# Patient Record
Sex: Female | Born: 1945
Health system: Southern US, Community
[De-identification: ages and names within clinical notes are randomized; demographics above are authoritative.]

## PROBLEM LIST (undated history)

## (undated) DIAGNOSIS — E041 Nontoxic single thyroid nodule: Secondary | ICD-10-CM

## (undated) DIAGNOSIS — H9312 Tinnitus, left ear: Secondary | ICD-10-CM

## (undated) DIAGNOSIS — F32A Depression, unspecified: Secondary | ICD-10-CM

## (undated) DIAGNOSIS — E7800 Pure hypercholesterolemia, unspecified: Secondary | ICD-10-CM

## (undated) DIAGNOSIS — I10 Essential (primary) hypertension: Secondary | ICD-10-CM

## (undated) DIAGNOSIS — E119 Type 2 diabetes mellitus without complications: Secondary | ICD-10-CM

## (undated) DIAGNOSIS — F329 Major depressive disorder, single episode, unspecified: Secondary | ICD-10-CM

## (undated) DIAGNOSIS — E0590 Thyrotoxicosis, unspecified without thyrotoxic crisis or storm: Secondary | ICD-10-CM

## (undated) DIAGNOSIS — M1990 Unspecified osteoarthritis, unspecified site: Secondary | ICD-10-CM

## (undated) HISTORY — DX: Tinnitus, left ear: H93.12

## (undated) HISTORY — PX: KNEE SURGERY: SHX244

## (undated) HISTORY — DX: Nontoxic single thyroid nodule: E04.1

## (undated) HISTORY — DX: Depression, unspecified: F32.A

## (undated) HISTORY — PX: APPENDECTOMY: SHX54

## (undated) HISTORY — DX: Thyrotoxicosis, unspecified without thyrotoxic crisis or storm: E05.90

## (undated) HISTORY — DX: Major depressive disorder, single episode, unspecified: F32.9

## (undated) HISTORY — DX: Unspecified osteoarthritis, unspecified site: M19.90

---

## 2009-05-30 ENCOUNTER — Emergency Department (HOSPITAL_COMMUNITY): Admission: EM | Admit: 2009-05-30 | Discharge: 2009-05-30 | Payer: Self-pay | Admitting: Emergency Medicine

## 2010-03-29 ENCOUNTER — Ambulatory Visit (HOSPITAL_COMMUNITY): Admission: RE | Admit: 2010-03-29 | Discharge: 2010-03-29 | Payer: Self-pay | Admitting: Family Medicine

## 2012-03-13 ENCOUNTER — Emergency Department (HOSPITAL_BASED_OUTPATIENT_CLINIC_OR_DEPARTMENT_OTHER)
Admission: EM | Admit: 2012-03-13 | Discharge: 2012-03-14 | Disposition: A | Discharge status: Home / Self Care | Payer: No Typology Code available for payment source | Attending: Emergency Medicine | Admitting: Emergency Medicine

## 2012-03-13 ENCOUNTER — Emergency Department (HOSPITAL_BASED_OUTPATIENT_CLINIC_OR_DEPARTMENT_OTHER): Payer: No Typology Code available for payment source

## 2012-03-13 ENCOUNTER — Encounter (HOSPITAL_BASED_OUTPATIENT_CLINIC_OR_DEPARTMENT_OTHER): Payer: Self-pay | Admitting: Emergency Medicine

## 2012-03-13 DIAGNOSIS — M546 Pain in thoracic spine: Secondary | ICD-10-CM | POA: Insufficient documentation

## 2012-03-13 DIAGNOSIS — E119 Type 2 diabetes mellitus without complications: Secondary | ICD-10-CM | POA: Insufficient documentation

## 2012-03-13 DIAGNOSIS — I10 Essential (primary) hypertension: Secondary | ICD-10-CM | POA: Insufficient documentation

## 2012-03-13 DIAGNOSIS — M47812 Spondylosis without myelopathy or radiculopathy, cervical region: Secondary | ICD-10-CM | POA: Insufficient documentation

## 2012-03-13 DIAGNOSIS — T148XXA Other injury of unspecified body region, initial encounter: Secondary | ICD-10-CM | POA: Insufficient documentation

## 2012-03-13 HISTORY — DX: Type 2 diabetes mellitus without complications: E11.9

## 2012-03-13 HISTORY — DX: Pure hypercholesterolemia, unspecified: E78.00

## 2012-03-13 HISTORY — DX: Essential (primary) hypertension: I10
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
## 2012-03-14 MED ORDER — HYDROCODONE-ACETAMINOPHEN 5-500 MG PO TABS: 1.0000 | ORAL_TABLET | Freq: Four times a day (QID) | ORAL | Status: DC | PRN

## 2012-03-14 MED ORDER — CYCLOBENZAPRINE HCL 10 MG PO TABS
10.0000 mg | ORAL_TABLET | Freq: Once | ORAL | Status: AC
  Administered 2012-03-14: 10 mg via ORAL
  Filled 2012-03-14: qty 1

## 2012-03-14 MED ORDER — CYCLOBENZAPRINE HCL 10 MG PO TABS: 10.0000 mg | ORAL_TABLET | Freq: Two times a day (BID) | ORAL | Status: DC | PRN

## 2012-03-14 NOTE — ED Notes (Signed)
MD at bedside. 
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
## 2012-03-14 NOTE — ED Notes (Signed)
Pt up ambulating around dept stating that she was going outside to smoke. Pt remains in C-collar, pt reminded that she had not been cleared by the EDP to be ambulating or to remove collar. Pt then reluctantly ambulated back to her room to await disposition. NAD noted, ambulated with a steady gait.

## 2012-07-02 ENCOUNTER — Other Ambulatory Visit: Payer: Self-pay | Admitting: Anesthesiology

## 2012-07-02 DIAGNOSIS — M545 Low back pain: Secondary | ICD-10-CM

## 2012-07-05 ENCOUNTER — Ambulatory Visit
Admission: RE | Admit: 2012-07-05 | Discharge: 2012-07-05 | Disposition: A | Discharge status: Home / Self Care | Payer: Medicare Other | Source: Ambulatory Visit | Attending: Anesthesiology | Admitting: Anesthesiology

## 2012-07-05 DIAGNOSIS — M545 Low back pain: Secondary | ICD-10-CM

## 2012-07-06 ENCOUNTER — Other Ambulatory Visit: Payer: Self-pay

## 2012-08-29 ENCOUNTER — Ambulatory Visit: Payer: No Typology Code available for payment source | Attending: Anesthesiology | Admitting: Physical Therapy

## 2012-08-29 DIAGNOSIS — M545 Low back pain, unspecified: Secondary | ICD-10-CM | POA: Insufficient documentation

## 2012-08-29 DIAGNOSIS — M25519 Pain in unspecified shoulder: Secondary | ICD-10-CM | POA: Insufficient documentation

## 2012-08-29 DIAGNOSIS — IMO0001 Reserved for inherently not codable concepts without codable children: Secondary | ICD-10-CM | POA: Insufficient documentation

## 2012-09-05 ENCOUNTER — Ambulatory Visit: Payer: Medicare Other | Attending: Anesthesiology | Admitting: Physical Therapy

## 2012-09-05 DIAGNOSIS — IMO0001 Reserved for inherently not codable concepts without codable children: Secondary | ICD-10-CM | POA: Diagnosis present

## 2012-09-05 DIAGNOSIS — M25519 Pain in unspecified shoulder: Secondary | ICD-10-CM | POA: Diagnosis not present

## 2012-09-05 DIAGNOSIS — M545 Low back pain, unspecified: Secondary | ICD-10-CM | POA: Insufficient documentation

## 2012-09-11 ENCOUNTER — Ambulatory Visit: Payer: No Typology Code available for payment source | Attending: Anesthesiology | Admitting: Physical Therapy

## 2012-09-11 DIAGNOSIS — M545 Low back pain, unspecified: Secondary | ICD-10-CM | POA: Insufficient documentation

## 2012-09-11 DIAGNOSIS — M25519 Pain in unspecified shoulder: Secondary | ICD-10-CM | POA: Insufficient documentation

## 2012-09-11 DIAGNOSIS — IMO0001 Reserved for inherently not codable concepts without codable children: Secondary | ICD-10-CM | POA: Insufficient documentation

## 2012-09-20 ENCOUNTER — Ambulatory Visit
Admission: RE | Admit: 2012-09-20 | Discharge: 2012-09-20 | Disposition: A | Discharge status: Home / Self Care | Payer: Medicare Other | Source: Ambulatory Visit | Attending: Nurse Practitioner | Admitting: Nurse Practitioner

## 2012-09-20 ENCOUNTER — Ambulatory Visit: Payer: No Typology Code available for payment source | Admitting: Physical Therapy

## 2012-09-20 ENCOUNTER — Other Ambulatory Visit: Payer: Self-pay | Admitting: Nurse Practitioner

## 2012-09-20 DIAGNOSIS — R05 Cough: Secondary | ICD-10-CM

## 2012-10-03 ENCOUNTER — Ambulatory Visit: Payer: No Typology Code available for payment source | Attending: Anesthesiology | Admitting: Physical Therapy

## 2012-10-03 DIAGNOSIS — M545 Low back pain, unspecified: Secondary | ICD-10-CM | POA: Insufficient documentation

## 2012-10-03 DIAGNOSIS — IMO0001 Reserved for inherently not codable concepts without codable children: Secondary | ICD-10-CM | POA: Insufficient documentation

## 2012-10-03 DIAGNOSIS — M25519 Pain in unspecified shoulder: Secondary | ICD-10-CM | POA: Insufficient documentation

## 2012-10-10 ENCOUNTER — Ambulatory Visit: Payer: No Typology Code available for payment source | Attending: Anesthesiology | Admitting: Physical Therapy

## 2012-10-10 DIAGNOSIS — M545 Low back pain, unspecified: Secondary | ICD-10-CM | POA: Insufficient documentation

## 2012-10-10 DIAGNOSIS — IMO0001 Reserved for inherently not codable concepts without codable children: Secondary | ICD-10-CM | POA: Insufficient documentation

## 2012-10-10 DIAGNOSIS — M25519 Pain in unspecified shoulder: Secondary | ICD-10-CM | POA: Insufficient documentation

## 2012-11-27 ENCOUNTER — Other Ambulatory Visit: Payer: Self-pay | Admitting: Neurological Surgery

## 2013-03-21 ENCOUNTER — Other Ambulatory Visit: Payer: Self-pay

## 2013-03-21 DIAGNOSIS — Z1231 Encounter for screening mammogram for malignant neoplasm of breast: Secondary | ICD-10-CM

## 2013-03-21 DIAGNOSIS — I10 Essential (primary) hypertension: Secondary | ICD-10-CM | POA: Diagnosis not present

## 2013-03-21 DIAGNOSIS — E785 Hyperlipidemia, unspecified: Secondary | ICD-10-CM | POA: Diagnosis not present

## 2013-03-21 DIAGNOSIS — E119 Type 2 diabetes mellitus without complications: Secondary | ICD-10-CM | POA: Diagnosis not present

## 2013-03-21 DIAGNOSIS — N390 Urinary tract infection, site not specified: Secondary | ICD-10-CM | POA: Diagnosis not present

## 2013-03-21 DIAGNOSIS — R946 Abnormal results of thyroid function studies: Secondary | ICD-10-CM | POA: Diagnosis not present

## 2013-03-21 DIAGNOSIS — Z0000 Encounter for general adult medical examination without abnormal findings: Secondary | ICD-10-CM | POA: Diagnosis not present

## 2013-03-21 DIAGNOSIS — Z124 Encounter for screening for malignant neoplasm of cervix: Secondary | ICD-10-CM | POA: Diagnosis not present

## 2013-03-21 DIAGNOSIS — Z01419 Encounter for gynecological examination (general) (routine) without abnormal findings: Secondary | ICD-10-CM | POA: Diagnosis not present

## 2013-03-21 DIAGNOSIS — E559 Vitamin D deficiency, unspecified: Secondary | ICD-10-CM | POA: Diagnosis not present

## 2013-03-21 DIAGNOSIS — Z1212 Encounter for screening for malignant neoplasm of rectum: Secondary | ICD-10-CM | POA: Diagnosis not present

## 2013-03-25 DIAGNOSIS — L219 Seborrheic dermatitis, unspecified: Secondary | ICD-10-CM | POA: Diagnosis not present

## 2013-04-08 ENCOUNTER — Other Ambulatory Visit (HOSPITAL_COMMUNITY): Payer: Self-pay

## 2013-04-11 ENCOUNTER — Ambulatory Visit: Payer: Medicare Other

## 2013-04-11 ENCOUNTER — Ambulatory Visit
Admission: RE | Admit: 2013-04-11 | Discharge: 2013-04-11 | Disposition: A | Discharge status: Home / Self Care | Payer: Medicare Other | Source: Ambulatory Visit

## 2013-04-11 DIAGNOSIS — Z1231 Encounter for screening mammogram for malignant neoplasm of breast: Secondary | ICD-10-CM | POA: Diagnosis not present

## 2013-04-15 ENCOUNTER — Other Ambulatory Visit: Payer: Self-pay | Admitting: Nurse Practitioner

## 2013-04-15 DIAGNOSIS — R928 Other abnormal and inconclusive findings on diagnostic imaging of breast: Secondary | ICD-10-CM

## 2013-04-17 ENCOUNTER — Inpatient Hospital Stay: Admit: 2013-04-17 | Payer: Self-pay | Admitting: Neurological Surgery

## 2013-04-17 SURGERY — FOR MAXIMUM ACCESS (MAS) POSTERIOR LUMBAR INTERBODY FUSION (PLIF) 1 LEVEL
Anesthesia: General | Site: Back

## 2013-04-18 ENCOUNTER — Ambulatory Visit: Payer: Self-pay

## 2013-04-29 DIAGNOSIS — L84 Corns and callosities: Secondary | ICD-10-CM | POA: Diagnosis not present

## 2013-04-29 DIAGNOSIS — E119 Type 2 diabetes mellitus without complications: Secondary | ICD-10-CM | POA: Diagnosis not present

## 2013-04-29 DIAGNOSIS — M79609 Pain in unspecified limb: Secondary | ICD-10-CM | POA: Diagnosis not present

## 2013-05-02 ENCOUNTER — Ambulatory Visit
Admission: RE | Admit: 2013-05-02 | Discharge: 2013-05-02 | Disposition: A | Discharge status: Home / Self Care | Payer: Medicare Other | Source: Ambulatory Visit | Attending: Nurse Practitioner | Admitting: Nurse Practitioner

## 2013-05-02 DIAGNOSIS — R928 Other abnormal and inconclusive findings on diagnostic imaging of breast: Secondary | ICD-10-CM | POA: Diagnosis not present

## 2013-12-13 DIAGNOSIS — I10 Essential (primary) hypertension: Secondary | ICD-10-CM | POA: Diagnosis not present

## 2013-12-13 DIAGNOSIS — M545 Low back pain, unspecified: Secondary | ICD-10-CM | POA: Diagnosis not present

## 2013-12-13 DIAGNOSIS — E119 Type 2 diabetes mellitus without complications: Secondary | ICD-10-CM | POA: Diagnosis not present

## 2013-12-13 DIAGNOSIS — Z7982 Long term (current) use of aspirin: Secondary | ICD-10-CM | POA: Diagnosis not present

## 2013-12-26 ENCOUNTER — Emergency Department (HOSPITAL_COMMUNITY)
Admission: EM | Admit: 2013-12-26 | Discharge: 2013-12-26 | Discharge status: Against Medical Advice | Payer: Medicare Other | Attending: Emergency Medicine | Admitting: Emergency Medicine

## 2013-12-26 ENCOUNTER — Encounter (HOSPITAL_COMMUNITY): Payer: Self-pay | Admitting: Emergency Medicine

## 2013-12-26 ENCOUNTER — Emergency Department (HOSPITAL_COMMUNITY): Payer: Medicare Other

## 2013-12-26 DIAGNOSIS — R079 Chest pain, unspecified: Secondary | ICD-10-CM | POA: Diagnosis not present

## 2013-12-26 DIAGNOSIS — E119 Type 2 diabetes mellitus without complications: Secondary | ICD-10-CM | POA: Diagnosis not present

## 2013-12-26 DIAGNOSIS — E785 Hyperlipidemia, unspecified: Secondary | ICD-10-CM | POA: Diagnosis not present

## 2013-12-26 DIAGNOSIS — E7800 Pure hypercholesterolemia, unspecified: Secondary | ICD-10-CM | POA: Insufficient documentation

## 2013-12-26 DIAGNOSIS — I10 Essential (primary) hypertension: Secondary | ICD-10-CM | POA: Insufficient documentation

## 2013-12-26 DIAGNOSIS — F17200 Nicotine dependence, unspecified, uncomplicated: Secondary | ICD-10-CM | POA: Diagnosis not present

## 2013-12-26 DIAGNOSIS — R0789 Other chest pain: Secondary | ICD-10-CM | POA: Diagnosis not present

## 2013-12-26 DIAGNOSIS — Z79899 Other long term (current) drug therapy: Secondary | ICD-10-CM | POA: Insufficient documentation

## 2013-12-26 DIAGNOSIS — Z7982 Long term (current) use of aspirin: Secondary | ICD-10-CM | POA: Insufficient documentation

## 2013-12-26 LAB — COMPREHENSIVE METABOLIC PANEL
ALBUMIN: 3.8 g/dL (ref 3.5–5.2)
ALK PHOS: 79 U/L (ref 39–117)
ALT: 23 U/L (ref 0–35)
AST: 24 U/L (ref 0–37)
Anion gap: 15 (ref 5–15)
BILIRUBIN TOTAL: 0.2 mg/dL — AB (ref 0.3–1.2)
BUN: 20 mg/dL (ref 6–23)
CO2: 22 mEq/L (ref 19–32)
Calcium: 9.9 mg/dL (ref 8.4–10.5)
Chloride: 100 mEq/L (ref 96–112)
Creatinine, Ser: 0.67 mg/dL (ref 0.50–1.10)
GFR calc Af Amer: >90 mL/min (ref >90)
GFR calc non Af Amer: 90 mL/min — ABNORMAL LOW (ref >90)
GLUCOSE: 150 mg/dL — AB (ref 70–99)
POTASSIUM: 3.9 meq/L (ref 3.7–5.3)
SODIUM: 137 meq/L (ref 137–147)
TOTAL PROTEIN: 7.3 g/dL (ref 6.0–8.3)

## 2013-12-26 LAB — CBC
HCT: 43.4 % (ref 36.0–46.0)
Hemoglobin: 14.8 g/dL (ref 12.0–15.0)
MCH: 30.9 pg (ref 26.0–34.0)
MCHC: 34.1 g/dL (ref 30.0–36.0)
MCV: 90.6 fL (ref 78.0–100.0)
Platelets: 318 K/uL (ref 150–400)
RBC: 4.79 MIL/uL (ref 3.87–5.11)
RDW: 12.4 % (ref 11.5–15.5)
WBC: 9.8 K/uL (ref 4.0–10.5)

## 2013-12-26 LAB — LIPASE, BLOOD: Lipase: 53 U/L (ref 11–59)

## 2013-12-26 LAB — I-STAT TROPONIN, ED: Troponin i, poc: 0 ng/mL (ref 0.00–0.08)

## 2013-12-26 MED ORDER — ONDANSETRON HCL 4 MG/2ML IJ SOLN
4.0000 mg | Freq: Once | INTRAMUSCULAR | Status: AC
  Administered 2013-12-26: 4 mg via INTRAVENOUS
  Filled 2013-12-26: qty 2

## 2013-12-26 MED ORDER — GI COCKTAIL ~~LOC~~
30.0000 mL | Freq: Once | ORAL | Status: AC
  Administered 2013-12-26: 30 mL via ORAL
  Filled 2013-12-26: qty 30

## 2013-12-26 MED ORDER — FAMOTIDINE IN NACL 20-0.9 MG/50ML-% IV SOLN
20.0000 mg | Freq: Once | INTRAVENOUS | Status: AC
  Administered 2013-12-26: 20 mg via INTRAVENOUS
  Filled 2013-12-26: qty 50

## 2013-12-26 MED ORDER — NITROGLYCERIN 0.4 MG SL SUBL
0.4000 mg | SUBLINGUAL_TABLET | SUBLINGUAL | Status: DC | PRN
Start: 2013-12-26 — End: 2013-12-26
  Administered 2013-12-26: 0.4 mg via SUBLINGUAL
  Filled 2013-12-26: qty 1

## 2013-12-26 MED ORDER — ASPIRIN 325 MG PO TABS
325.0000 mg | ORAL_TABLET | ORAL | Status: AC
  Administered 2013-12-26: 325 mg via ORAL
  Filled 2013-12-26: qty 1

## 2013-12-26 NOTE — ED Provider Notes (Signed)
CSN: [id number]     Arrival date & time 12/26/13  79 History   First MD Initiated Contact with Patient 12/26/13 1537     Chief Complaint  Patient presents with  . Chest Pain     (Consider location/radiation/quality/duration/timing/severity/associated sxs/prior Treatment) HPI 68 year old female with history of tobacco abuse, hypertension, diabetes, hyperlipidemia, presents with less than one hour gradual onset left-sided aching chest discomfort radiating to her left arm with nausea with slight tingling to her left small finger with no cough no shortness breath no fever no sharp or stabbing pain no sudden pain no pleuritic pain and no recent exertion; patient has not had discomfort like this in the past she denies any burning sensation or sour taste in the throat or difficulty swallowing she is no vomiting no abdominal pain no diarrhea no recent bloody stools no recent travel or immobilization in no treatment prior to arrival. Her pain is constant moderately severe aching sensation it started mildly and is gradually worsening. Past Medical History  Diagnosis Date  . Hypertension   . Diabetes mellitus without complication   . High cholesterol    Past Surgical History  Procedure Laterality Date  . Appendectomy    . Knee surgery     No family history on file. History  Substance Use Topics  . Smoking status: Current Every Day Smoker  . Smokeless tobacco: Not on file  . Alcohol Use: No   OB History   Grav Para Term Preterm Abortions TAB SAB Ect Mult Living                 Review of Systems 10 Systems reviewed and are negative for acute change except as noted in the HPI.   Allergies  Review of patient's allergies indicates no known allergies.  Home Medications   Prior to Admission medications   Medication Sig Start Date End Date Taking? Authorizing Provider  aspirin EC 81 MG tablet Take 81 mg by mouth daily.   Yes Historical Provider, MD  glipiZIDE (GLUCOTROL) 10 MG tablet  Take 10 mg by mouth daily before breakfast.    Yes Historical Provider, MD  lisinopril-hydrochlorothiazide (PRINZIDE,ZESTORETIC) 20-25 MG per tablet Take 1 tablet by mouth daily.  12/17/13  Yes Historical Provider, MD  oxyCODONE-acetaminophen (PERCOCET/ROXICET) 5-325 MG per tablet Take 1 tablet by mouth every 6 (six) hours as needed for moderate pain.  12/13/13  Yes Historical Provider, MD  pravastatin (PRAVACHOL) 40 MG tablet Take 40 mg by mouth daily.  10/05/13  Yes Historical Provider, MD   BP 115/70  Pulse 72  Temp(Src) 97.7 F (36.5 C) (Oral)  Resp 18  SpO2 96% Physical Exam  Nursing note and vitals reviewed. Constitutional:  Awake, alert, nontoxic appearance.  HENT:  Head: Atraumatic.  Eyes: Right eye exhibits no discharge. Left eye exhibits no discharge.  Neck: Neck supple.  Cardiovascular: Normal rate and regular rhythm.   No murmur heard. Pulmonary/Chest: Effort normal and breath sounds normal. No respiratory distress. She has no wheezes. She has no rales. She exhibits no tenderness.  Abdominal: Soft. Bowel sounds are normal. She exhibits no distension and no mass. There is no tenderness. There is no rebound and no guarding.  Musculoskeletal: She exhibits no edema and no tenderness.  Baseline ROM, no obvious new focal weakness.  Neurological: She is alert.  Mental status and motor strength appears baseline for patient and situation.  Skin: No rash noted.  Psychiatric: She has a normal mood and affect.    ED Course  Procedures (including critical care time) HEART score 5 w/o troponin results Patient understand and agree with initial ED impression and plan with expectations set for ED visit.   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 Patient states she will return to the emergency department for repeat troponin and observation afterward afterward. [place] Reviewed  COMPREHENSIVE METABOLIC PANEL - Abnormal; Notable for the following:    Glucose, Bld 150 (*)    Total Bilirubin 0.2 (*)    GFR calc non Af Amer 90 (*)    All other components within normal limits  CBC  LIPASE, BLOOD  I-STAT TROPOININ, ED  Randolm Idol, ED    Imaging Review Dg Chest Port 1 View  12/26/2013   CLINICAL DATA:  Chest pain.  EXAM: PORTABLE CHEST - 1 VIEW  COMPARISON:  09/20/2012.  FINDINGS: Mediastinum and hilar structures normal. Lungs are clear. Heart size normal.  IMPRESSION: No acute cardiopulmonary disease.  Stable chest from prior exam.   Electronically Signed   By: Marcello Moores  Register   On: 12/26/2013 16:43     EKG Interpretation   Date/Time:  Thursday December 26 2013 15:44:12 EDT Ventricular Rate:  74 PR Interval:  189 QRS Duration: 99 QT Interval:  386 QTC Calculation: 428 R Axis:   -54 Text Interpretation:  Sinus rhythm LAD, consider left anterior fascicular  block No previous ECGs available Confirmed by [facility]  MD, Jenny Reichmann ([id number]) on  12/26/2013 4:03:01 PM      MDM   Final diagnoses:  Chest pain, unspecified chest pain type    Cannot r/o AMI or ACS.    Babette Relic, MD 12/27/13 2118

## 2013-12-26 NOTE — ED Notes (Signed)
Pt leaving AMA. Bednar MD discussed with pt risks for leaving. Pt reports must leave to take care of friend.

## 2013-12-26 NOTE — Progress Notes (Signed)
  CARE MANAGEMENT ED NOTE 12/26/2013  Patient:  Valerie, Wade   Account Number:  [id number]  Date Initiated:  12/26/2013  Documentation initiated by:  Livia Snellen  Subjective/Objective Assessment:   Patient presents to Ed with left sided chest pain with left arm tingling     Subjective/Objective Assessment Detail:   Patient with pmhx of HTN, DM, High cholesterol     Action/Plan:   Action/Plan Detail:   Anticipated DC Date:       Status Recommendation to Physician:   Result of Recommendation:    Other ED [place]  Other  PCP issues    Choice offered to / List presented to:            Status of service:  Completed, signed off  ED Comments:   ED Comments Detail:  EDCM spoke to patient at bedside.  Patient confirms her pcp is located at [facility] on [address] in [place] phone number [phone]. Patient cannot recall the name of her pcp at this time. System updated.

## 2013-12-26 NOTE — Discharge Instructions (Signed)
°  [place] (call 911) IF: You have recurrent chest pain, especially if the pain is crushing or pressure-like and spreads to the arms, back, neck, or jaw, or if you have sweating, nausea (feeling sick to your stomach), or shortness of breath. THIS IS AN EMERGENCY. Don't wait to see if the pain will go away. Get medical help at once. Call 911 or 0 (operator). DO NOT drive yourself to the hospital.  He develop shortness of breath or other concerns. You feel dizzy or faint.

## 2013-12-26 NOTE — ED Notes (Addendum)
Per pt constant left sided chest pain with left arm tingling starting an hour ago. Pt denies SOB but reports nausea.  MD at bedside.

## 2014-01-06 DIAGNOSIS — Z6826 Body mass index (BMI) 26.0-26.9, adult: Secondary | ICD-10-CM | POA: Diagnosis not present

## 2014-01-06 DIAGNOSIS — M545 Low back pain, unspecified: Secondary | ICD-10-CM | POA: Diagnosis not present

## 2014-01-06 DIAGNOSIS — M48061 Spinal stenosis, lumbar region without neurogenic claudication: Secondary | ICD-10-CM | POA: Diagnosis not present

## 2014-01-06 DIAGNOSIS — M47817 Spondylosis without myelopathy or radiculopathy, lumbosacral region: Secondary | ICD-10-CM | POA: Diagnosis not present

## 2014-03-28 DIAGNOSIS — E559 Vitamin D deficiency, unspecified: Secondary | ICD-10-CM | POA: Diagnosis not present

## 2014-03-28 DIAGNOSIS — I10 Essential (primary) hypertension: Secondary | ICD-10-CM | POA: Diagnosis not present

## 2014-03-28 DIAGNOSIS — Z0000 Encounter for general adult medical examination without abnormal findings: Secondary | ICD-10-CM | POA: Diagnosis not present

## 2014-03-28 DIAGNOSIS — F418 Other specified anxiety disorders: Secondary | ICD-10-CM | POA: Diagnosis not present

## 2014-03-28 DIAGNOSIS — E784 Other hyperlipidemia: Secondary | ICD-10-CM | POA: Diagnosis not present

## 2014-03-28 DIAGNOSIS — E119 Type 2 diabetes mellitus without complications: Secondary | ICD-10-CM | POA: Diagnosis not present

## 2014-03-28 DIAGNOSIS — E038 Other specified hypothyroidism: Secondary | ICD-10-CM | POA: Diagnosis not present

## 2014-03-31 DIAGNOSIS — Z6826 Body mass index (BMI) 26.0-26.9, adult: Secondary | ICD-10-CM | POA: Diagnosis not present

## 2014-03-31 DIAGNOSIS — M545 Low back pain: Secondary | ICD-10-CM | POA: Diagnosis not present

## 2014-03-31 DIAGNOSIS — M4806 Spinal stenosis, lumbar region: Secondary | ICD-10-CM | POA: Diagnosis not present

## 2014-03-31 DIAGNOSIS — M47816 Spondylosis without myelopathy or radiculopathy, lumbar region: Secondary | ICD-10-CM | POA: Diagnosis not present

## 2014-04-23 DIAGNOSIS — E782 Mixed hyperlipidemia: Secondary | ICD-10-CM | POA: Diagnosis not present

## 2014-04-23 DIAGNOSIS — I10 Essential (primary) hypertension: Secondary | ICD-10-CM | POA: Diagnosis not present

## 2014-04-23 DIAGNOSIS — F418 Other specified anxiety disorders: Secondary | ICD-10-CM | POA: Diagnosis not present

## 2014-04-23 DIAGNOSIS — E1165 Type 2 diabetes mellitus with hyperglycemia: Secondary | ICD-10-CM | POA: Diagnosis not present

## 2014-05-29 ENCOUNTER — Other Ambulatory Visit: Payer: Self-pay

## 2014-05-29 DIAGNOSIS — Z1231 Encounter for screening mammogram for malignant neoplasm of breast: Secondary | ICD-10-CM

## 2014-06-05 DIAGNOSIS — H11002 Unspecified pterygium of left eye: Secondary | ICD-10-CM | POA: Diagnosis not present

## 2014-06-05 DIAGNOSIS — E119 Type 2 diabetes mellitus without complications: Secondary | ICD-10-CM | POA: Diagnosis not present

## 2014-06-05 DIAGNOSIS — H25813 Combined forms of age-related cataract, bilateral: Secondary | ICD-10-CM | POA: Diagnosis not present

## 2014-06-05 DIAGNOSIS — D3130 Benign neoplasm of unspecified choroid: Secondary | ICD-10-CM | POA: Diagnosis not present

## 2014-06-09 ENCOUNTER — Ambulatory Visit
Admission: RE | Admit: 2014-06-09 | Discharge: 2014-06-09 | Disposition: A | Discharge status: Home / Self Care | Payer: Medicare Other | Source: Ambulatory Visit

## 2014-06-09 DIAGNOSIS — Z1231 Encounter for screening mammogram for malignant neoplasm of breast: Secondary | ICD-10-CM | POA: Diagnosis not present

## 2014-06-26 DIAGNOSIS — M4806 Spinal stenosis, lumbar region: Secondary | ICD-10-CM | POA: Diagnosis not present

## 2014-06-26 DIAGNOSIS — M545 Low back pain: Secondary | ICD-10-CM | POA: Diagnosis not present

## 2014-06-26 DIAGNOSIS — M47816 Spondylosis without myelopathy or radiculopathy, lumbar region: Secondary | ICD-10-CM | POA: Diagnosis not present

## 2014-06-26 DIAGNOSIS — Z6827 Body mass index (BMI) 27.0-27.9, adult: Secondary | ICD-10-CM | POA: Diagnosis not present

## 2014-07-09 DIAGNOSIS — F339 Major depressive disorder, recurrent, unspecified: Secondary | ICD-10-CM | POA: Diagnosis not present

## 2014-07-09 DIAGNOSIS — E782 Mixed hyperlipidemia: Secondary | ICD-10-CM | POA: Diagnosis not present

## 2014-07-09 DIAGNOSIS — E1165 Type 2 diabetes mellitus with hyperglycemia: Secondary | ICD-10-CM | POA: Diagnosis not present

## 2014-07-09 DIAGNOSIS — I10 Essential (primary) hypertension: Secondary | ICD-10-CM | POA: Diagnosis not present

## 2014-09-17 DIAGNOSIS — M4806 Spinal stenosis, lumbar region: Secondary | ICD-10-CM | POA: Diagnosis not present

## 2014-09-17 DIAGNOSIS — M47816 Spondylosis without myelopathy or radiculopathy, lumbar region: Secondary | ICD-10-CM | POA: Diagnosis not present

## 2014-09-17 DIAGNOSIS — Z6826 Body mass index (BMI) 26.0-26.9, adult: Secondary | ICD-10-CM | POA: Diagnosis not present

## 2014-09-17 DIAGNOSIS — M545 Low back pain: Secondary | ICD-10-CM | POA: Diagnosis not present

## 2014-10-08 DIAGNOSIS — I10 Essential (primary) hypertension: Secondary | ICD-10-CM | POA: Diagnosis not present

## 2014-10-08 DIAGNOSIS — G4700 Insomnia, unspecified: Secondary | ICD-10-CM | POA: Diagnosis not present

## 2014-10-08 DIAGNOSIS — E119 Type 2 diabetes mellitus without complications: Secondary | ICD-10-CM | POA: Diagnosis not present

## 2014-10-08 DIAGNOSIS — E559 Vitamin D deficiency, unspecified: Secondary | ICD-10-CM | POA: Diagnosis not present

## 2014-10-08 DIAGNOSIS — E1165 Type 2 diabetes mellitus with hyperglycemia: Secondary | ICD-10-CM | POA: Diagnosis not present

## 2014-12-11 DIAGNOSIS — M47816 Spondylosis without myelopathy or radiculopathy, lumbar region: Secondary | ICD-10-CM | POA: Diagnosis not present

## 2014-12-11 DIAGNOSIS — M4806 Spinal stenosis, lumbar region: Secondary | ICD-10-CM | POA: Diagnosis not present

## 2014-12-11 DIAGNOSIS — M545 Low back pain: Secondary | ICD-10-CM | POA: Diagnosis not present

## 2015-01-14 DIAGNOSIS — R634 Abnormal weight loss: Secondary | ICD-10-CM | POA: Diagnosis not present

## 2015-01-14 DIAGNOSIS — R42 Dizziness and giddiness: Secondary | ICD-10-CM | POA: Diagnosis not present

## 2015-01-14 DIAGNOSIS — F329 Major depressive disorder, single episode, unspecified: Secondary | ICD-10-CM | POA: Diagnosis not present

## 2015-01-14 DIAGNOSIS — Z1389 Encounter for screening for other disorder: Secondary | ICD-10-CM | POA: Diagnosis not present

## 2015-01-14 DIAGNOSIS — R946 Abnormal results of thyroid function studies: Secondary | ICD-10-CM | POA: Diagnosis not present

## 2015-01-14 DIAGNOSIS — E1165 Type 2 diabetes mellitus with hyperglycemia: Secondary | ICD-10-CM | POA: Diagnosis not present

## 2015-01-15 ENCOUNTER — Telehealth: Payer: Self-pay | Admitting: *Deleted

## 2015-01-15 ENCOUNTER — Other Ambulatory Visit: Payer: Self-pay | Admitting: Nurse Practitioner

## 2015-01-15 ENCOUNTER — Ambulatory Visit
Admission: RE | Admit: 2015-01-15 | Discharge: 2015-01-15 | Disposition: A | Discharge status: Home / Self Care | Payer: Medicare Other | Source: Ambulatory Visit | Attending: Internal Medicine | Admitting: Internal Medicine

## 2015-01-15 DIAGNOSIS — R634 Abnormal weight loss: Secondary | ICD-10-CM

## 2015-01-15 DIAGNOSIS — R05 Cough: Secondary | ICD-10-CM

## 2015-01-15 DIAGNOSIS — R059 Cough, unspecified: Secondary | ICD-10-CM

## 2015-01-15 NOTE — Telephone Encounter (Signed)
Valerie Wade with me, but please let pt know that I no longer practice chronic pain management, and will not be able to prescribe long term narcotics schedule II or higher  thanks

## 2015-01-15 NOTE — Telephone Encounter (Signed)
Pt called in and wanted to know if you would be willing to take her on has  New pt?

## 2015-01-16 NOTE — Telephone Encounter (Signed)
Got scheduled.  Patient states she currently has a pain management Dr.

## 2015-01-21 ENCOUNTER — Other Ambulatory Visit (HOSPITAL_COMMUNITY): Payer: Self-pay | Admitting: Endocrinology

## 2015-01-21 DIAGNOSIS — E0590 Thyrotoxicosis, unspecified without thyrotoxic crisis or storm: Secondary | ICD-10-CM

## 2015-01-28 DIAGNOSIS — M25512 Pain in left shoulder: Secondary | ICD-10-CM | POA: Diagnosis not present

## 2015-01-28 DIAGNOSIS — Z720 Tobacco use: Secondary | ICD-10-CM | POA: Diagnosis not present

## 2015-01-28 DIAGNOSIS — R946 Abnormal results of thyroid function studies: Secondary | ICD-10-CM | POA: Diagnosis not present

## 2015-01-28 DIAGNOSIS — W101XXA Fall (on)(from) sidewalk curb, initial encounter: Secondary | ICD-10-CM | POA: Diagnosis not present

## 2015-02-04 ENCOUNTER — Ambulatory Visit (HOSPITAL_COMMUNITY)
Admission: RE | Admit: 2015-02-04 | Discharge: 2015-02-04 | Disposition: A | Discharge status: Home / Self Care | Payer: Medicare Other | Source: Ambulatory Visit | Attending: Endocrinology | Admitting: Endocrinology

## 2015-02-04 DIAGNOSIS — E0590 Thyrotoxicosis, unspecified without thyrotoxic crisis or storm: Secondary | ICD-10-CM

## 2015-02-04 MED ORDER — SODIUM IODIDE I 131 CAPSULE
13.6800 | Freq: Once | INTRAVENOUS | Status: AC | PRN
  Administered 2015-02-04: 13.68 via ORAL

## 2015-02-05 ENCOUNTER — Ambulatory Visit (HOSPITAL_COMMUNITY)
Admission: RE | Admit: 2015-02-05 | Discharge: 2015-02-05 | Disposition: A | Discharge status: Home / Self Care | Payer: Medicare Other | Source: Ambulatory Visit | Attending: Endocrinology | Admitting: Endocrinology

## 2015-02-05 DIAGNOSIS — E0590 Thyrotoxicosis, unspecified without thyrotoxic crisis or storm: Secondary | ICD-10-CM | POA: Diagnosis not present

## 2015-02-05 DIAGNOSIS — R5383 Other fatigue: Secondary | ICD-10-CM | POA: Insufficient documentation

## 2015-02-05 DIAGNOSIS — R634 Abnormal weight loss: Secondary | ICD-10-CM | POA: Insufficient documentation

## 2015-02-05 DIAGNOSIS — J029 Acute pharyngitis, unspecified: Secondary | ICD-10-CM | POA: Diagnosis not present

## 2015-02-05 MED ORDER — SODIUM IODIDE I 131 CAPSULE
13.6000 | Freq: Once | INTRAVENOUS | Status: AC | PRN
  Administered 2015-02-05: 13.6 via ORAL

## 2015-02-05 MED ORDER — SODIUM PERTECHNETATE TC 99M INJECTION
10.0000 | Freq: Once | INTRAVENOUS | Status: AC | PRN
Start: 2015-02-05 — End: 2015-02-05
  Administered 2015-02-05: 10 via INTRAVENOUS

## 2015-02-10 ENCOUNTER — Other Ambulatory Visit: Payer: Self-pay | Admitting: Nurse Practitioner

## 2015-02-10 DIAGNOSIS — E0590 Thyrotoxicosis, unspecified without thyrotoxic crisis or storm: Secondary | ICD-10-CM | POA: Diagnosis not present

## 2015-02-11 ENCOUNTER — Encounter: Payer: Medicare Other | Attending: Internal Medicine | Admitting: *Deleted

## 2015-02-11 ENCOUNTER — Encounter: Payer: Self-pay | Admitting: *Deleted

## 2015-02-11 VITALS — Ht 67.0 in | Wt 154.6 lb

## 2015-02-11 DIAGNOSIS — E119 Type 2 diabetes mellitus without complications: Secondary | ICD-10-CM | POA: Diagnosis not present

## 2015-02-11 DIAGNOSIS — Z713 Dietary counseling and surveillance: Secondary | ICD-10-CM | POA: Insufficient documentation

## 2015-02-11 NOTE — Patient Instructions (Addendum)
Plan:  Aim for 2-3 Carb Choices per meal (30-45 grams) +/- 1 either way  Aim for 0-15 Carbs per snack if hungry  Include protein in moderation with your meals and snacks Consider reading food labels for Total Carbohydrate and Fat Grams of foods Consider  increasing your activity level by walking/ exercising for 30 minutes daily as tolerated Consider checking BG at alternate times per day to include fasting (first in the morning before any food) and 2 hours AFTER you largest meal as directed by MD  Continue taking medication as directed by MD  Fasting less than 130  (110) 2 hours after meal  Way less than 180  (140)  Try to change your sleep schedule to get up around 8:00 in the morning and go to bed by midnight.  Have a snack before bed - carb & protein  Keep meter and glucose tablets at bedside in case you wake up and feel low. Test glucose and correct sugar.

## 2015-02-17 NOTE — Progress Notes (Signed)
Diabetes Self-Management Education  Visit Type: First/Initial  Appt. Start Time: 1545 Appt. End Time: 2563  02/17/2015  [name], identified by name and date of birth, is a 69 y.o. female with a diagnosis of Diabetes: Type 2.   ASSESSMENT  Height 5\' 7"  (1.702 m), weight 154 lb 9.6 oz (70.126 kg). Body mass index is 24.21 kg/(m^2).      Diabetes Self-Management Education - 02/11/15 1612    Visit Information   Visit Type First/Initial   Initial Visit   Diabetes Type Type 2   Are you currently following a meal plan? Yes   Are you taking your medications as prescribed? Yes   Health Coping   How would you rate your overall health? Fair   Psychosocial Assessment   Patient Belief/Attitude about Diabetes Motivated to manage diabetes   Self-care barriers None   Self-management support Doctor's office;CDE visits   Other persons present Patient   Patient Concerns Nutrition/Meal planning;Monitoring;Healthy Lifestyle;Glycemic Control   Special Needs None   Preferred Learning Style No preference indicated   Learning Readiness Change in progress   How often do you need to have someone help you when you read instructions, pamphlets, or other written materials from your doctor or pharmacy? 1 - Never   Complications   Last HgB A1C per patient/outside source 7.6 %   How often do you check your blood sugar? Patient declines  Patient agreed to test. Provided with glucometer: One Touch Verio Flex Lot: [id number] X Exp 02/2016   Have you had a dilated eye exam in the past 12 months? Yes   Have you had a dental exam in the past 12 months? Yes   Are you checking your feet? Yes   How many days per week are you checking your feet? 1   Dietary Intake   Breakfast 11:00AM pizza/  tuna sandwich , water, coffee   Lunch 2:00 PM Tomato Sandwich / egg salade sandwich   Dinner 7:00 PM  rice, chicken, lemon grass, celantro, bullion, carrots, celery   Beverage(s) water, coffee,   Exercise   Exercise Type ADL's  "couch potato"   How many days per week to you exercise? 0   How many minutes per day do you exercise? 0   Total minutes per week of exercise 0   Patient Education   Previous Diabetes Education Yes (please comment)   Disease state  Definition of diabetes, type 1 and 2, and the diagnosis of diabetes;Factors that contribute to the development of diabetes   Nutrition management  Role of diet in the treatment of diabetes and the relationship between the three main macronutrients and blood glucose level;Food label reading, portion sizes and measuring food.;Carbohydrate counting   Physical activity and exercise  Role of exercise on diabetes management, blood pressure control and cardiac health.   Chronic complications Relationship between chronic complications and blood glucose control;Dental care;Retinopathy and reason for yearly dilated eye exams   Psychosocial adjustment Role of stress on diabetes   Individualized Goals (developed by patient)   Nutrition General guidelines for healthy choices and portions discussed   Physical Activity Exercise 3-5 times per week;30 minutes per day   Monitoring  test my blood glucose as discussed  alternate FBS & 2hpp every other day due to limit in test strips   Reducing Risk do foot checks daily;increase portions of nuts and seeds   Outcomes   Expected Outcomes Demonstrated interest in learning. Expect positive outcomes   Future DMSE PRN  Program Status Completed      Individualized Plan for Diabetes Self-Management Training:   Learning Objective:  Patient will have a greater understanding of diabetes self-management. Patient education plan is to attend individual and/or group sessions per assessed needs and concerns.   Plan:   Patient Instructions  Plan:  Aim for 2-3 Carb Choices per meal (30-45 grams) +/- 1 either way  Aim for 0-15 Carbs per snack if hungry  Include protein in moderation with your meals and snacks Consider  reading food labels for Total Carbohydrate and Fat Grams of foods Consider  increasing your activity level by walking/ exercising for 30 minutes daily as tolerated Consider checking BG at alternate times per day to include fasting (first in the morning before any food) and 2 hours AFTER you largest meal as directed by MD  Continue taking medication as directed by MD  Fasting less than 130  (110) 2 hours after meal  Way less than 180  (140)  Try to change your sleep schedule to get up around 8:00 in the morning and go to bed by midnight.  Have a snack before bed - carb & protein  Keep meter and glucose tablets at bedside in case you wake up and feel low. Test glucose and correct sugar.   Expected Outcomes:  Demonstrated interest in learning. Expect positive outcomes  Education material provided: Living Well with Diabetes, A1C conversion sheet, Meal plan card, My Plate and Snack sheet  If problems or questions, patient to contact team via:  Phone  Future DSME appointment: PRN

## 2015-03-12 DIAGNOSIS — M47816 Spondylosis without myelopathy or radiculopathy, lumbar region: Secondary | ICD-10-CM | POA: Diagnosis not present

## 2015-03-12 DIAGNOSIS — M545 Low back pain: Secondary | ICD-10-CM | POA: Diagnosis not present

## 2015-03-12 DIAGNOSIS — M4806 Spinal stenosis, lumbar region: Secondary | ICD-10-CM | POA: Diagnosis not present

## 2015-03-19 ENCOUNTER — Ambulatory Visit (INDEPENDENT_AMBULATORY_CARE_PROVIDER_SITE_OTHER): Payer: Medicare Other | Admitting: Internal Medicine

## 2015-03-19 ENCOUNTER — Encounter: Payer: Self-pay | Admitting: Internal Medicine

## 2015-03-19 VITALS — BP 98/62 | HR 76 | Temp 97.4°F | Wt 151.0 lb

## 2015-03-19 DIAGNOSIS — Z23 Encounter for immunization: Secondary | ICD-10-CM

## 2015-03-19 DIAGNOSIS — H9312 Tinnitus, left ear: Secondary | ICD-10-CM | POA: Insufficient documentation

## 2015-03-19 DIAGNOSIS — Z20828 Contact with and (suspected) exposure to other viral communicable diseases: Secondary | ICD-10-CM

## 2015-03-19 DIAGNOSIS — G8929 Other chronic pain: Secondary | ICD-10-CM | POA: Insufficient documentation

## 2015-03-19 DIAGNOSIS — E119 Type 2 diabetes mellitus without complications: Secondary | ICD-10-CM | POA: Diagnosis not present

## 2015-03-19 DIAGNOSIS — E0590 Thyrotoxicosis, unspecified without thyrotoxic crisis or storm: Secondary | ICD-10-CM | POA: Insufficient documentation

## 2015-03-19 DIAGNOSIS — M1990 Unspecified osteoarthritis, unspecified site: Secondary | ICD-10-CM | POA: Insufficient documentation

## 2015-03-19 DIAGNOSIS — F329 Major depressive disorder, single episode, unspecified: Secondary | ICD-10-CM | POA: Insufficient documentation

## 2015-03-19 DIAGNOSIS — I10 Essential (primary) hypertension: Secondary | ICD-10-CM | POA: Insufficient documentation

## 2015-03-19 DIAGNOSIS — F32A Depression, unspecified: Secondary | ICD-10-CM | POA: Insufficient documentation

## 2015-03-19 DIAGNOSIS — E7800 Pure hypercholesterolemia, unspecified: Secondary | ICD-10-CM

## 2015-03-19 DIAGNOSIS — M545 Low back pain: Secondary | ICD-10-CM

## 2015-03-19 DIAGNOSIS — F419 Anxiety disorder, unspecified: Secondary | ICD-10-CM | POA: Insufficient documentation

## 2015-03-19 NOTE — Assessment & Plan Note (Signed)
stable overall by history and exam, recent data reviewed with pt, and pt to continue medical treatment as before,  to f/u any worsening symptoms or concerns No results found for: [facility] For f/u lab, goal < 70

## 2015-03-19 NOTE — Patient Instructions (Addendum)
You had the flu shot today  Please return for nurse Visit appt in 2 wks for the Prevnar 13 pneumonia shot  Please continue all other medications as before, and refills have been done if requested.  Please have the pharmacy call with any other refills you may need.  Please continue your efforts at being more active, low cholesterol diet, and weight control.  You are otherwise up to date with prevention measures today.  You will be contacted regarding the referral for: podiatry  Please keep your appointments with your specialists as you may have planned  Please go to the LAB in the Basement (turn left off the elevator) for the tests to be done today  You will be contacted by phone if any changes need to be made immediately.  Otherwise, you will receive a letter about your results with an explanation, but please check with MyChart first.  Please remember to sign up for MyChart if you have not done so, as this will be important to you in the future with finding out test results, communicating by private email, and scheduling acute appointments online when needed.  Please return in 6 months, or sooner if needed

## 2015-03-19 NOTE — Assessment & Plan Note (Signed)
With 26 lbs wt loss over 2 wks, as f/u with endo planned

## 2015-03-19 NOTE — Assessment & Plan Note (Signed)
stable overall by history and exam, recent data reviewed with pt, and pt to continue medical treatment as before,  to f/u any worsening symptoms or concerns No results found for: [facility]BA1[facility] For f/u lab

## 2015-03-19 NOTE — Progress Notes (Signed)
Patient received education resource, including the self-management goal and tool. Patient verbalized understanding. 

## 2015-03-19 NOTE — Assessment & Plan Note (Signed)
stable overall by history and exam, recent data reviewed with pt, and pt to continue medical treatment as before,  to f/u any worsening symptoms or concerns BP Readings from Last 3 Encounters:  03/19/15 98/62  12/26/13 115/70  03/14/12 120/84

## 2015-03-19 NOTE — Progress Notes (Signed)
Subjective:    Patient ID: Valerie Wade, female    DOB: 1946/04/26, 69 y.o.   MRN: [id number]  HPI  Here for wellness and f/u;  Overall doing ok;  Pt denies Chest pain, worsening SOB, DOE, wheezing, orthopnea, PND, worsening LE edema, palpitations, dizziness or syncope.  Pt denies neurological change such as new headache, facial or extremity weakness.  Pt denies polydipsia, polyuria, or low sugar symptoms. Pt states overall good compliance with treatment and medications, good tolerability, and has been trying to follow appropriate diet.  Pt denies worsening depressive symptoms, suicidal ideation or panic. No fever, night sweats, wt loss, loss of appetite, or other constitutional symptoms.  Pt states good ability with ADL's, has low fall risk, home safety reviewed and adequate, no other significant changes in hearing or vision, and only occasionally active with exercise.  Due for podiatry. Sees endo for hyperthyroid, s/p thyroid scan, plan is for rad iodine per endo on [address] [facility].  Sees pain managemnt for chronic lbp after fall with completed workers comp settlement Past Medical History  Diagnosis Date  . Hypertension   . Diabetes mellitus without complication ([place])   . High cholesterol   . Depression   . Hyperthyroidism   . Tinnitus of left ear   . Arthritis     blat thumb cmc   Past Surgical History  Procedure Laterality Date  . Appendectomy    . Knee surgery Left     meniscal tear    reports that she has been smoking.  She does not have any smokeless tobacco history on file. She reports that she does not drink alcohol or use illicit drugs. family history includes Diabetes in her brother, mother, and sister. No Known Allergies Current Outpatient Prescriptions on File Prior to Visit  Medication Sig Dispense Refill  . aspirin EC 81 MG tablet Take 81 mg by mouth daily.    Marland Kitchen lisinopril-hydrochlorothiazide (PRINZIDE,ZESTORETIC) 20-25 MG per tablet Take 1 tablet by mouth daily.      . metFORMIN (GLUCOPHAGE) 500 MG tablet Take 500 mg by mouth daily with breakfast.    . pravastatin (PRAVACHOL) 40 MG tablet Take 40 mg by mouth daily.      No current facility-administered medications on file prior to visit.   Review of Systems Constitutional: Negative for increased diaphoresis, other activity, appetite or siginficant weight change other than noted HENT: Negative for worsening hearing loss, ear pain, facial swelling, mouth sores and neck stiffness.   Eyes: Negative for other worsening pain, redness or visual disturbance.  Respiratory: Negative for shortness of breath and wheezing  Cardiovascular: Negative for chest pain and palpitations.  Gastrointestinal: Negative for diarrhea, blood in stool, abdominal distention or other pain Genitourinary: Negative for hematuria, flank pain or change in urine volume.  Musculoskeletal: Negative for myalgias or other joint complaints.  Skin: Negative for color change and wound or drainage.  Neurological: Negative for syncope and numbness. other than noted Hematological: Negative for adenopathy. or other swelling Psychiatric/Behavioral: Negative for hallucinations, SI, self-injury, decreased concentration or other worsening agitation.      Objective:   Physical Exam BP 98/62 mmHg  Pulse 76  Temp(Src) 97.4 F (36.3 C)  Wt 151 lb (68.493 kg)  SpO2 96% VS noted,  Constitutional: Pt is oriented to person, place, and time. Appears well-developed and well-nourished, in no significant distress Head: Normocephalic and atraumatic.  Right Ear: External ear normal.  Left Ear: External ear normal.  Nose: Nose normal.  Mouth/Throat: Oropharynx is  clear and moist.  Eyes: Conjunctivae and EOM are normal. Pupils are equal, round, and reactive to light.  Neck: Normal range of motion. Neck supple. No JVD present. No tracheal deviation present or significant neck LA or mass Cardiovascular: Normal rate, regular rhythm, normal heart sounds and  intact distal pulses.   Pulmonary/Chest: Effort normal and breath sounds without rales or wheezing  Abdominal: Soft. Bowel sounds are normal. NT. No HSM  Musculoskeletal: Normal range of motion. Exhibits no edema.  Lymphadenopathy:  Has no cervical adenopathy.  Neurological: Pt is alert and oriented to person, place, and time. Pt has normal reflexes. No cranial nerve deficit. Motor grossly intact Skin: Skin is warm and dry. No rash noted.  Psychiatric:  Has normal mood and affect. Behavior is normal.        Assessment & Plan:

## 2015-03-19 NOTE — Assessment & Plan Note (Signed)
stable overall by history and exam, recent data reviewed with pt, and pt to continue medical treatment as before,  to f/u any worsening symptoms or concerns Lab Results  Component Value Date   WBC 9.8 12/26/2013   HGB 14.8 12/26/2013   HCT 43.4 12/26/2013   PLT 318 12/26/2013   GLUCOSE 150* 12/26/2013   ALT 23 12/26/2013   AST 24 12/26/2013   NA 137 12/26/2013   K 3.9 12/26/2013   CL 100 12/26/2013   CREATININE 0.67 12/26/2013   BUN 20 12/26/2013   CO2 22 12/26/2013

## 2015-03-23 ENCOUNTER — Other Ambulatory Visit (INDEPENDENT_AMBULATORY_CARE_PROVIDER_SITE_OTHER): Payer: Medicare Other

## 2015-03-23 DIAGNOSIS — F329 Major depressive disorder, single episode, unspecified: Secondary | ICD-10-CM

## 2015-03-23 DIAGNOSIS — I10 Essential (primary) hypertension: Secondary | ICD-10-CM | POA: Diagnosis not present

## 2015-03-23 DIAGNOSIS — Z20828 Contact with and (suspected) exposure to other viral communicable diseases: Secondary | ICD-10-CM | POA: Diagnosis not present

## 2015-03-23 DIAGNOSIS — E119 Type 2 diabetes mellitus without complications: Secondary | ICD-10-CM | POA: Diagnosis not present

## 2015-03-23 DIAGNOSIS — F32A Depression, unspecified: Secondary | ICD-10-CM

## 2015-03-23 DIAGNOSIS — E7800 Pure hypercholesterolemia, unspecified: Secondary | ICD-10-CM

## 2015-03-23 LAB — URINALYSIS, ROUTINE W REFLEX MICROSCOPIC
BILIRUBIN URINE: NEGATIVE
Ketones, ur: NEGATIVE
NITRITE: NEGATIVE
PH: 5.5 (ref 5.0–8.0)
Specific Gravity, Urine: >=1.03 — AB (ref 1.000–1.030)
TOTAL PROTEIN, URINE-UPE24: NEGATIVE
URINE GLUCOSE: NEGATIVE
UROBILINOGEN UA: 0.2 (ref 0.0–1.0)

## 2015-03-23 LAB — CBC WITH DIFFERENTIAL/PLATELET
Basophils Absolute: 0.1 K/uL (ref 0.0–0.1)
Basophils Relative: 0.7 % (ref 0.0–3.0)
Eosinophils Absolute: 0.1 K/uL (ref 0.0–0.7)
Eosinophils Relative: 1.3 % (ref 0.0–5.0)
HCT: 43.7 % (ref 36.0–46.0)
Hemoglobin: 14.8 g/dL (ref 12.0–15.0)
Lymphocytes Relative: 22.2 % (ref 12.0–46.0)
Lymphs Abs: 2 K/uL (ref 0.7–4.0)
MCHC: 33.8 g/dL (ref 30.0–36.0)
MCV: 88.9 fl (ref 78.0–100.0)
Monocytes Absolute: 0.6 K/uL (ref 0.1–1.0)
Monocytes Relative: 6.7 % (ref 3.0–12.0)
Neutro Abs: 6.4 K/uL (ref 1.4–7.7)
Neutrophils Relative %: 69.1 % (ref 43.0–77.0)
Platelets: 330 K/uL (ref 150.0–400.0)
RBC: 4.92 Mil/uL (ref 3.87–5.11)
RDW: 12.6 % (ref 11.5–15.5)
WBC: 9.2 K/uL (ref 4.0–10.5)

## 2015-03-23 LAB — LDL CHOLESTEROL, DIRECT: Direct LDL: 126 mg/dL

## 2015-03-23 LAB — BASIC METABOLIC PANEL
BUN: 21 mg/dL (ref 6–23)
CO2: 24 mEq/L (ref 19–32)
CREATININE: 0.68 mg/dL (ref 0.40–1.20)
Calcium: 9.8 mg/dL (ref 8.4–10.5)
Chloride: 105 mEq/L (ref 96–112)
GFR: 91.64 mL/min (ref >60.00)
Glucose, Bld: 232 mg/dL — ABNORMAL HIGH (ref 70–99)
Potassium: 3.8 mEq/L (ref 3.5–5.1)
Sodium: 138 mEq/L (ref 135–145)

## 2015-03-23 LAB — HEPATIC FUNCTION PANEL
ALT: 13 U/L (ref 0–35)
AST: 13 U/L (ref 0–37)
Albumin: 3.9 g/dL (ref 3.5–5.2)
Alkaline Phosphatase: 74 U/L (ref 39–117)
BILIRUBIN TOTAL: 0.5 mg/dL (ref 0.2–1.2)
Bilirubin, Direct: 0.1 mg/dL (ref 0.0–0.3)
Total Protein: 7.1 g/dL (ref 6.0–8.3)

## 2015-03-23 LAB — MICROALBUMIN / CREATININE URINE RATIO
Creatinine,U: 169.4 mg/dL
Microalb Creat Ratio: 1.9 mg/g (ref 0.0–30.0)
Microalb, Ur: 3.3 mg/dL — ABNORMAL HIGH (ref 0.0–1.9)

## 2015-03-23 LAB — TSH: TSH: 0.76 u[IU]/mL (ref 0.35–4.50)

## 2015-03-23 LAB — LIPID PANEL
Cholesterol: 191 mg/dL (ref 0–200)
HDL: 42.4 mg/dL
NonHDL: 148.41
Total CHOL/HDL Ratio: 5
Triglycerides: 204 mg/dL — ABNORMAL HIGH (ref 0.0–149.0)
VLDL: 40.8 mg/dL — ABNORMAL HIGH (ref 0.0–40.0)

## 2015-03-23 LAB — HEMOGLOBIN A1C: Hgb A1c MFr Bld: 7.2 % — ABNORMAL HIGH (ref 4.6–6.5)

## 2015-03-24 ENCOUNTER — Encounter: Payer: Self-pay | Admitting: Internal Medicine

## 2015-03-24 LAB — HEPATITIS C ANTIBODY: HCV AB: NEGATIVE

## 2015-04-02 ENCOUNTER — Telehealth: Payer: Self-pay | Admitting: *Deleted

## 2015-04-02 ENCOUNTER — Ambulatory Visit (INDEPENDENT_AMBULATORY_CARE_PROVIDER_SITE_OTHER): Payer: Medicare Other

## 2015-04-02 DIAGNOSIS — Z23 Encounter for immunization: Secondary | ICD-10-CM

## 2015-04-02 MED ORDER — METFORMIN HCL 500 MG PO TABS: 500.0000 mg | ORAL_TABLET | Freq: Every day | ORAL | Status: DC

## 2015-04-02 MED ORDER — LISINOPRIL-HYDROCHLOROTHIAZIDE 20-25 MG PO TABS: 1.0000 | ORAL_TABLET | Freq: Every day | ORAL | Status: DC

## 2015-04-02 MED ORDER — PRAVASTATIN SODIUM 40 MG PO TABS: 40.0000 mg | ORAL_TABLET | Freq: Every day | ORAL | Status: DC

## 2015-04-02 NOTE — Telephone Encounter (Signed)
Received call pt states she need refill on her lisinopril. Had her cpx on 03/11/15. Verified pharmacy inform sending to [facility]...Johny Chess

## 2015-04-08 ENCOUNTER — Telehealth: Payer: Self-pay | Admitting: *Deleted

## 2015-04-08 MED ORDER — ATORVASTATIN CALCIUM 20 MG PO TABS: 20.0000 mg | ORAL_TABLET | Freq: Every day | ORAL | Status: DC

## 2015-04-08 NOTE — Telephone Encounter (Signed)
Ok to take the lipitor but at 20 mg per da (and stop or do not start the pravachol)

## 2015-04-08 NOTE — Telephone Encounter (Signed)
Notified pt with md response. Updated med list..../lmb 

## 2015-04-08 NOTE — Telephone Encounter (Signed)
Receive call pt states she wasn't aware that her cholesterol medication was change to Pravastatin. She has been taking Astorvastatin 10 mg for about 10 years now. Per chart it stated that the Astorvastatin was d/c back in 11/2013. Wanting to clarify if she suppose to take the Pravastatin or continue with the generic lipitor...Johny Chess

## 2015-04-20 ENCOUNTER — Telehealth: Payer: Self-pay | Admitting: Internal Medicine

## 2015-04-20 NOTE — Telephone Encounter (Signed)
Rec'd from [facility] forward 20 pages to Dr. Jenny Reichmann

## 2015-05-01 ENCOUNTER — Ambulatory Visit (INDEPENDENT_AMBULATORY_CARE_PROVIDER_SITE_OTHER): Payer: Medicare Other | Admitting: Podiatry

## 2015-05-01 ENCOUNTER — Encounter: Payer: Self-pay | Admitting: Podiatry

## 2015-05-01 VITALS — BP 122/76 | HR 81 | Resp 12

## 2015-05-01 DIAGNOSIS — M2010 Hallux valgus (acquired), unspecified foot: Secondary | ICD-10-CM | POA: Diagnosis not present

## 2015-05-01 DIAGNOSIS — E119 Type 2 diabetes mellitus without complications: Secondary | ICD-10-CM | POA: Diagnosis not present

## 2015-05-01 NOTE — Progress Notes (Signed)
   Subjective:    Patient ID: Valerie Wade, female    DOB: 1945/06/09, 69 y.o.   MRN: [id number]  HPIThis diabetic patient presents to the office for diabetic foot exam and diabetic shoes.  She has received a previous pair diabetic shoes and Dr. Jenny Reichmann referred her to the office to receive another pair.  She presents for evaluation and treatment.  Patient presents here today for a request of diabetic shoes. Her last pair were given 1 year,9 months ago.   Review of Systems  All other systems reviewed and are negative.      Objective:   Physical Exam GENERAL APPEARANCE: Alert, conversant. Appropriately groomed. No acute distress.  VASCULAR: Pedal pulses palpable at  [facility] and PT bilateral.  Capillary refill time is immediate to all digits,  Normal temperature gradient.  Digital hair growth is present bilateral  NEUROLOGIC: sensation is normal to 5.07 monofilament at 5/5 sites bilateral.  Light touch is intact bilateral, Muscle strength normal. Vibratory WNL. MUSCULOSKELETAL: acceptable muscle strength, tone and stability bilateral.  Intrinsic muscluature intact bilateral.  Rectus appearance of foot and digits noted bilateral.   DERMATOLOGIC: skin color, texture, and turgor are within normal limits.  No preulcerative lesions or ulcers  are seen, no interdigital maceration noted.  No open lesions present.  Digital nails are asymptomatic. No drainage noted.         Assessment & Plan:  Diabetes  With no complications  HAV  B/L  IE  Initiate diabetic shoe paperwork.  Gardiner Barefoot DPM

## 2015-06-08 DIAGNOSIS — E0590 Thyrotoxicosis, unspecified without thyrotoxic crisis or storm: Secondary | ICD-10-CM | POA: Diagnosis not present

## 2015-06-11 DIAGNOSIS — M4806 Spinal stenosis, lumbar region: Secondary | ICD-10-CM | POA: Diagnosis not present

## 2015-06-11 DIAGNOSIS — M47816 Spondylosis without myelopathy or radiculopathy, lumbar region: Secondary | ICD-10-CM | POA: Diagnosis not present

## 2015-06-11 DIAGNOSIS — M545 Low back pain: Secondary | ICD-10-CM | POA: Diagnosis not present

## 2015-06-29 ENCOUNTER — Ambulatory Visit: Payer: Medicare Other | Admitting: *Deleted

## 2015-06-29 DIAGNOSIS — E119 Type 2 diabetes mellitus without complications: Secondary | ICD-10-CM

## 2015-06-30 NOTE — Progress Notes (Signed)
Patient ID: Valerie Wade, female   DOB: 1946-05-19, 70 y.o.   MRN: [id number] Patient presents to be scanned and measured for diabetic shoes and inserts.

## 2015-07-16 ENCOUNTER — Other Ambulatory Visit: Payer: Self-pay

## 2015-07-16 DIAGNOSIS — Z1231 Encounter for screening mammogram for malignant neoplasm of breast: Secondary | ICD-10-CM

## 2015-07-17 ENCOUNTER — Ambulatory Visit (INDEPENDENT_AMBULATORY_CARE_PROVIDER_SITE_OTHER): Payer: Medicare Other | Admitting: Podiatry

## 2015-07-17 DIAGNOSIS — L84 Corns and callosities: Secondary | ICD-10-CM | POA: Diagnosis not present

## 2015-07-17 DIAGNOSIS — E119 Type 2 diabetes mellitus without complications: Secondary | ICD-10-CM | POA: Diagnosis not present

## 2015-07-17 DIAGNOSIS — M2010 Hallux valgus (acquired), unspecified foot: Secondary | ICD-10-CM | POA: Diagnosis not present

## 2015-07-17 NOTE — Patient Instructions (Signed)

## 2015-07-17 NOTE — Progress Notes (Signed)
Patient ID: Valerie Wade, female   DOB: 31-Jan-1946, 70 y.o.   MRN: [id number] Patient presents for diabetic shoe pick up, shoes are tried on for good fit.  Patient received 1 Pair Hushpuppies Power walker II double strap black in women's size 9.5 wide and 3 pairs custom molded diabetic inserts.  Verbal and written break in and wear instructions given.  Patient will follow up for scheduled routine care.   Objective:   Physical Exam GENERAL APPEARANCE: Alert, conversant. Appropriately groomed. No acute distress.  VASCULAR: Pedal pulses palpable at [facility] and PT bilateral. Capillary refill time is immediate to all digits, Normal temperature gradient. Digital hair growth is present bilateral  NEUROLOGIC: sensation is normal to 5.07 monofilament at 5/5 sites bilateral. Light touch is intact bilateral, Muscle strength normal. Vibratory WNL. MUSCULOSKELETAL: acceptable muscle strength, tone and stability bilateral. Intrinsic muscluature intact bilateral. Rectus appearance of foot and digits noted bilateral.  HAV B/L  DERMATOLOGIC: skin color, texture, and turgor are within normal limits. No preulcerative lesions or ulcers are seen, no interdigital maceration noted. No open lesions present. Digital nails are asymptomatic. No drainage noted       Diabetic Neuropathy  Dispense shoes. Patient presents today and was dispensed 0ne pair ( two units) of medically necessary extra depth shoes with three pair( six units) of custom molded multiple density inserts. The shoes and the inserts are fitted to the patients ' feet and are noted to fit well and are free of defect.  Length and width of the shoes are also acceptable.  Patient was given written and verbal  instructions for wearing.  If any concerns arrive with the shoes or inserts, the patient is to call the office.Patient is to follow up with doctor in six weeks.   Gardiner Barefoot DPM

## 2015-07-23 DIAGNOSIS — H25813 Combined forms of age-related cataract, bilateral: Secondary | ICD-10-CM | POA: Diagnosis not present

## 2015-07-23 DIAGNOSIS — E0590 Thyrotoxicosis, unspecified without thyrotoxic crisis or storm: Secondary | ICD-10-CM | POA: Diagnosis not present

## 2015-07-23 DIAGNOSIS — E119 Type 2 diabetes mellitus without complications: Secondary | ICD-10-CM | POA: Diagnosis not present

## 2015-07-23 DIAGNOSIS — D3130 Benign neoplasm of unspecified choroid: Secondary | ICD-10-CM | POA: Diagnosis not present

## 2015-08-06 ENCOUNTER — Ambulatory Visit
Admission: RE | Admit: 2015-08-06 | Discharge: 2015-08-06 | Disposition: A | Discharge status: Home / Self Care | Payer: Medicare Other | Source: Ambulatory Visit

## 2015-08-06 DIAGNOSIS — Z1231 Encounter for screening mammogram for malignant neoplasm of breast: Secondary | ICD-10-CM | POA: Diagnosis not present

## 2015-08-26 ENCOUNTER — Ambulatory Visit: Payer: Medicare Other | Admitting: *Deleted

## 2015-08-26 DIAGNOSIS — E119 Type 2 diabetes mellitus without complications: Secondary | ICD-10-CM

## 2015-08-26 NOTE — Patient Instructions (Signed)

## 2015-08-26 NOTE — Progress Notes (Signed)
Patient ID: Valerie Wade, female   DOB: [birth year], 70 y.o.   MRN: [id number] Patient presents to pick up reordered shoes, shoes are tried on for good fit. Patient received 1 pair Apex X801W Walker lace black in women's 9.5 wide.

## 2015-09-02 DIAGNOSIS — Z79891 Long term (current) use of opiate analgesic: Secondary | ICD-10-CM | POA: Diagnosis not present

## 2015-09-02 DIAGNOSIS — Z5181 Encounter for therapeutic drug level monitoring: Secondary | ICD-10-CM | POA: Diagnosis not present

## 2015-09-02 DIAGNOSIS — M47816 Spondylosis without myelopathy or radiculopathy, lumbar region: Secondary | ICD-10-CM | POA: Diagnosis not present

## 2015-09-02 DIAGNOSIS — M545 Low back pain: Secondary | ICD-10-CM | POA: Diagnosis not present

## 2015-09-02 DIAGNOSIS — M4806 Spinal stenosis, lumbar region: Secondary | ICD-10-CM | POA: Diagnosis not present

## 2015-09-02 DIAGNOSIS — Z79899 Other long term (current) drug therapy: Secondary | ICD-10-CM | POA: Diagnosis not present

## 2015-09-17 ENCOUNTER — Ambulatory Visit: Payer: Self-pay | Admitting: Internal Medicine

## 2015-09-22 ENCOUNTER — Telehealth: Payer: Self-pay

## 2015-09-22 MED ORDER — METFORMIN HCL 500 MG PO TABS: 500.0000 mg | ORAL_TABLET | Freq: Every day | ORAL | Status: DC

## 2015-09-22 MED ORDER — PROPRANOLOL HCL 10 MG PO TABS: 10.0000 mg | ORAL_TABLET | Freq: Two times a day (BID) | ORAL | Status: DC

## 2015-09-22 NOTE — Telephone Encounter (Signed)
Medications sent to [facility]

## 2015-09-22 NOTE — Telephone Encounter (Signed)
Patient says [facility] sent over request for refills. She is leaving tomorrow at 12 to go on a trip and would like them filled. PLease follow up, thank you. It is her propranolol and metformin

## 2015-09-29 ENCOUNTER — Telehealth: Payer: Self-pay | Admitting: *Deleted

## 2015-09-29 MED ORDER — METFORMIN HCL ER 500 MG PO TB24: 500.0000 mg | ORAL_TABLET | Freq: Every day | ORAL | Status: DC

## 2015-09-29 NOTE — Telephone Encounter (Signed)
Left msg on triage stating her rx for Metformin was sent in wrong. She states the Metformin ER requesting new rx to be sent to pharmacy. Called pt to verify sent new [facility] to [facility].....Johny Chess

## 2015-10-01 ENCOUNTER — Encounter: Payer: Self-pay | Admitting: Internal Medicine

## 2015-10-01 ENCOUNTER — Ambulatory Visit (INDEPENDENT_AMBULATORY_CARE_PROVIDER_SITE_OTHER): Payer: Medicare Other | Admitting: Internal Medicine

## 2015-10-01 ENCOUNTER — Other Ambulatory Visit: Payer: Self-pay | Admitting: Internal Medicine

## 2015-10-01 ENCOUNTER — Other Ambulatory Visit (INDEPENDENT_AMBULATORY_CARE_PROVIDER_SITE_OTHER): Payer: Medicare Other

## 2015-10-01 VITALS — BP 138/80 | HR 69 | Temp 98.4°F | Resp 20 | Wt 164.0 lb

## 2015-10-01 DIAGNOSIS — G8929 Other chronic pain: Secondary | ICD-10-CM

## 2015-10-01 DIAGNOSIS — E7800 Pure hypercholesterolemia, unspecified: Secondary | ICD-10-CM | POA: Diagnosis not present

## 2015-10-01 DIAGNOSIS — I10 Essential (primary) hypertension: Secondary | ICD-10-CM | POA: Diagnosis not present

## 2015-10-01 DIAGNOSIS — M545 Low back pain, unspecified: Secondary | ICD-10-CM

## 2015-10-01 DIAGNOSIS — Z1211 Encounter for screening for malignant neoplasm of colon: Secondary | ICD-10-CM | POA: Diagnosis not present

## 2015-10-01 DIAGNOSIS — E119 Type 2 diabetes mellitus without complications: Secondary | ICD-10-CM

## 2015-10-01 DIAGNOSIS — E041 Nontoxic single thyroid nodule: Secondary | ICD-10-CM

## 2015-10-01 HISTORY — DX: Nontoxic single thyroid nodule: E04.1

## 2015-10-01 LAB — LIPID PANEL
CHOL/HDL RATIO: 4
Cholesterol: 177 mg/dL (ref 0–200)
HDL: 41.8 mg/dL (ref >39.00)
LDL CALC: 113 mg/dL — AB (ref 0–99)
NONHDL: 135.55
TRIGLYCERIDES: 111 mg/dL (ref 0.0–149.0)
VLDL: 22.2 mg/dL (ref 0.0–40.0)

## 2015-10-01 LAB — BASIC METABOLIC PANEL
BUN: 19 mg/dL (ref 6–23)
CO2: 23 mEq/L (ref 19–32)
CREATININE: 0.66 mg/dL (ref 0.40–1.20)
Calcium: 9.4 mg/dL (ref 8.4–10.5)
Chloride: 106 mEq/L (ref 96–112)
GFR: 94.7 mL/min (ref >60.00)
Glucose, Bld: 230 mg/dL — ABNORMAL HIGH (ref 70–99)
POTASSIUM: 4 meq/L (ref 3.5–5.1)
Sodium: 137 mEq/L (ref 135–145)

## 2015-10-01 LAB — HEPATIC FUNCTION PANEL
ALBUMIN: 4 g/dL (ref 3.5–5.2)
ALK PHOS: 77 U/L (ref 39–117)
ALT: 13 U/L (ref 0–35)
AST: 12 U/L (ref 0–37)
BILIRUBIN DIRECT: 0.1 mg/dL (ref 0.0–0.3)
TOTAL PROTEIN: 7 g/dL (ref 6.0–8.3)
Total Bilirubin: 0.4 mg/dL (ref 0.2–1.2)

## 2015-10-01 LAB — HEMOGLOBIN A1C: Hgb A1c MFr Bld: 8.2 % — ABNORMAL HIGH (ref 4.6–6.5)

## 2015-10-01 MED ORDER — HYDROCODONE-ACETAMINOPHEN 7.5-325 MG PO TABS: 1.0000 | ORAL_TABLET | Freq: Four times a day (QID) | ORAL | Status: DC | PRN

## 2015-10-01 MED ORDER — METFORMIN HCL ER 500 MG PO TB24
ORAL_TABLET | ORAL | Status: DC
Start: 2015-10-01 — End: 2016-07-20

## 2015-10-01 MED ORDER — ATORVASTATIN CALCIUM 40 MG PO TABS: 40.0000 mg | ORAL_TABLET | Freq: Every day | ORAL | Status: DC

## 2015-10-01 NOTE — Patient Instructions (Addendum)
Please continue all other medications as before, and refills have been done if requested - the pain medication  Please make an appt at the Pain clinic for further refills  Please have the pharmacy call with any other refills you may need.  Please continue your efforts at being more active, low cholesterol diet, and weight control.  You are otherwise up to date with prevention measures today.  Please keep your appointments with your specialists as you may have planned  Please go to the LAB in the Basement (turn left off the elevator) for the tests to be done today  You will be contacted by phone if any changes need to be made immediately.  Otherwise, you will receive a letter about your results with an explanation, but please check with MyChart first.  Please remember to sign up for MyChart if you have not done so, as this will be important to you in the future with finding out test results, communicating by private email, and scheduling acute appointments online when needed.  Please return in 6 months, or sooner if needed  You will be contacted regarding the referral for: colonoscopy

## 2015-10-01 NOTE — Progress Notes (Signed)
   Subjective:    Patient ID: Valerie Wade, female    DOB: 1946-05-21, 70 y.o.   MRN: [id number]  HPI  Here for yearly f/u;  Overall doing ok;  Pt denies Chest pain, worsening SOB, DOE, wheezing, orthopnea, PND, worsening LE edema, palpitations, dizziness or syncope.  Pt denies neurological change such as new headache, facial or extremity weakness.  Pt denies polydipsia, polyuria, or low sugar symptoms. Pt states overall good compliance with treatment and medications, good tolerability, and has been trying to follow appropriate diet.  Pt denies worsening depressive symptoms, suicidal ideation or panic. No fever, night sweats, wt loss, loss of appetite, or other constitutional symptoms.  Pt states good ability with ADL's, has low fall risk, home safety reviewed and adequate, no other significant changes in hearing or vision, and only occasionally active with exercise. Asks for pain med refill until can get to pain management. Past Medical History  Diagnosis Date  . Hypertension   . Diabetes mellitus without complication ([place])   . High cholesterol   . Depression   . Hyperthyroidism   . Tinnitus of left ear   . Arthritis     blat thumb cmc  . Left thyroid nodule 10/01/2015   Past Surgical History  Procedure Laterality Date  . Appendectomy    . Knee surgery Left     meniscal tear    reports that she has been smoking.  She does not have any smokeless tobacco history on file. She reports that she does not drink alcohol or use illicit drugs. family history includes Diabetes in her brother, mother, and sister. No Known Allergies Current Outpatient Prescriptions on File Prior to Visit  Medication Sig Dispense Refill  . aspirin EC 81 MG tablet Take 81 mg by mouth daily.    Marland Kitchen lisinopril-hydrochlorothiazide (PRINZIDE,ZESTORETIC) 20-25 MG tablet Take 1 tablet by mouth daily. 90 tablet 3  . propranolol (INDERAL) 10 MG tablet Take 1 tablet (10 mg total) by mouth 2 (two) times daily. 60 tablet 3    No current facility-administered medications on file prior to visit.    Review of Systems  Constitutional: Negative for unusual diaphoresis or night sweats HENT: Negative for ear swelling or discharge Eyes: Negative for worsening visual haziness  Respiratory: Negative for choking and stridor.   Gastrointestinal: Negative for distension or worsening eructation Genitourinary: Negative for retention or change in urine volume.  Musculoskeletal: Negative for other MSK pain or swelling Skin: Negative for color change and worsening wound Neurological: Negative for tremors and numbness other than noted  Psychiatric/Behavioral: Negative for decreased concentration or agitation other than above       Objective:   Physical Exam BP 138/80 mmHg  Pulse 69  Temp(Src) 98.4 F (36.9 C) (Oral)  Resp 20  Wt 164 lb (74.39 kg)  SpO2 94% VS noted,  Constitutional: Pt appears in no apparent distress HENT: Head: NCAT.  Right Ear: External ear normal.  Left Ear: External ear normal.  Eyes: . Pupils are equal, round, and reactive to light. Conjunctivae and EOM are normal Neck: Normal range of motion. Neck supple.  Cardiovascular: Normal rate and regular rhythm.   Pulmonary/Chest: Effort normal and breath sounds without rales or wheezing.  Abd:  Soft, NT, ND, + BS Neurological: Pt is alert. Not confused , motor grossly intact Skin: Skin is warm. No rash, no LE edema Psychiatric: Pt behavior is normal. No agitation.     Assessment & Plan:

## 2015-10-01 NOTE — Progress Notes (Signed)
Pre visit review using our clinic review tool, if applicable. No additional management support is needed unless otherwise documented below in the visit note. 

## 2015-10-03 NOTE — Assessment & Plan Note (Signed)
For 1 mo tx narcotic, pt to cont f/u with new pain clinic

## 2015-10-03 NOTE — Assessment & Plan Note (Signed)
stable overall by history and exam, recent data reviewed with pt, and pt to continue medical treatment as before,  to f/u any worsening symptoms or concerns BP Readings from Last 3 Encounters:  10/01/15 138/80  05/01/15 122/76  03/19/15 98/62

## 2015-10-03 NOTE — Assessment & Plan Note (Signed)
Uncontrolled, for increased lipitor 40 qd,  to f/u any worsening symptoms or concerns

## 2015-10-03 NOTE — Assessment & Plan Note (Signed)
stable overall by history and exam, and pt to continue medical treatment as before,  to f/u any worsening symptoms or concerns, for f/u lab 

## 2015-10-05 ENCOUNTER — Telehealth: Payer: Self-pay

## 2015-10-05 NOTE — Telephone Encounter (Signed)
Patient states that Dr. Jenny Reichmann was going to call her something else in for her cholesterol. But the pharmacy never got the RX. Please follow up

## 2015-10-06 MED ORDER — ATORVASTATIN CALCIUM 40 MG PO TABS: 40.0000 mg | ORAL_TABLET | Freq: Every day | ORAL | Status: DC

## 2015-10-06 NOTE — Telephone Encounter (Signed)
Please advise, were we supposed to call in another medication

## 2015-10-06 NOTE — Telephone Encounter (Signed)
The new dose of 40 mg daily lipitor was sent may 4  I have re-sent today as well

## 2015-10-19 ENCOUNTER — Telehealth: Payer: Self-pay | Admitting: Internal Medicine

## 2015-10-19 DIAGNOSIS — G8929 Other chronic pain: Secondary | ICD-10-CM

## 2015-10-19 DIAGNOSIS — M545 Low back pain, unspecified: Secondary | ICD-10-CM

## 2015-10-19 NOTE — Telephone Encounter (Signed)
Is requesting Dr. Jenny Reichmann to refer her to [facility]

## 2015-10-20 NOTE — Telephone Encounter (Signed)
Ok, this has been done 

## 2015-11-12 ENCOUNTER — Encounter: Payer: Self-pay | Admitting: Internal Medicine

## 2015-11-12 ENCOUNTER — Ambulatory Visit (INDEPENDENT_AMBULATORY_CARE_PROVIDER_SITE_OTHER): Payer: Medicare Other | Admitting: Internal Medicine

## 2015-11-12 VITALS — BP 120/76 | HR 72 | Temp 98.3°F | Resp 20 | Wt 163.0 lb

## 2015-11-12 DIAGNOSIS — M545 Low back pain, unspecified: Secondary | ICD-10-CM

## 2015-11-12 DIAGNOSIS — I10 Essential (primary) hypertension: Secondary | ICD-10-CM

## 2015-11-12 DIAGNOSIS — G8929 Other chronic pain: Secondary | ICD-10-CM

## 2015-11-12 DIAGNOSIS — M25511 Pain in right shoulder: Secondary | ICD-10-CM | POA: Diagnosis not present

## 2015-11-12 DIAGNOSIS — E119 Type 2 diabetes mellitus without complications: Secondary | ICD-10-CM

## 2015-11-12 DIAGNOSIS — M898X1 Other specified disorders of bone, shoulder: Secondary | ICD-10-CM | POA: Insufficient documentation

## 2015-11-12 MED ORDER — HYDROCODONE-ACETAMINOPHEN 7.5-325 MG PO TABS: 1.0000 | ORAL_TABLET | Freq: Four times a day (QID) | ORAL | Status: DC | PRN

## 2015-11-12 NOTE — Progress Notes (Signed)
Pre visit review using our clinic review tool, if applicable. No additional management support is needed unless otherwise documented below in the visit note. 

## 2015-11-12 NOTE — Patient Instructions (Signed)
Please be sure to take 2 of the metformin every day  Please continue all other medications as before, and refills have been done if requested - the norco  Please have the pharmacy call with any other refills you may need.  Please continue your efforts at being more active, low cholesterol diet, and weight control.  You are otherwise up to date with prevention measures today.  Please keep your appointments with your specialists as you may have planned - the pain management appt on June 25  I think we can hold on imaging xrays of the right collarbone, but please return if does not improve over the next 1-2 wks

## 2015-11-12 NOTE — Progress Notes (Signed)
Subjective:    Patient ID: Valerie Wade, female    DOB: [birth year], 70 y.o.   MRN: [id number]  HPI  Here to f/u, Pt denies chest pain, increasing sob or doe, wheezing, orthopnea, PND, increased LE swelling, palpitations, dizziness or syncope.  Pt denies new neurological symptoms such as new headache, or facial or extremity weakness or numbness.  Pt denies polydipsia, polyuria, or low sugar episode.   Pt denies new neurological symptoms such as new headache, or facial or extremity weakness or numbness.   Pt states overall good compliance with meds, mostly trying to follow appropriate diet, with wt overall stable,  but little exercise however.  Only taking 1 metformin, was not aware she was supposed to take 2.  Trying to work on better diet,  [place] on the stairs 3 time in 6 mo, then once outside the home a few mon before that, usually assoc with a sharp stabbing pain to the lower back that gets her off balance in reation. Pt continues to have recurring LBP without change in severity, bowel or bladder change, fever, wt loss,  worsening LE pain/numbness/weakness.  Last fall April 2017 and fell to left side, so right arm and shoulder seemed to follow a jerk toward the ground across the body, as she also tried to catch herself on a rail.  Now has pain primarily it seems to the medial third of the right collarbone, but no swelling, no rash, no other neck or shoulder pain worsening but does have occas neck pain posteriorly.  Pain of concern today is worse to moving the right arm towards the left acorss the body but o/w FROM.  There is a click to collarbone area with moving the arm laterally toward the body downwards after raised up.    Chronic pain persists, has appt with [facility] June 25, needs further norco until then. Past Medical History  Diagnosis Date  . Hypertension   . Diabetes mellitus without complication ([place])   . High cholesterol   . Depression   . Hyperthyroidism   .  Tinnitus of left ear   . Arthritis     blat thumb cmc  . Left thyroid nodule 10/01/2015   Past Surgical History  Procedure Laterality Date  . Appendectomy    . Knee surgery Left     meniscal tear    reports that she has been smoking.  She does not have any smokeless tobacco history on file. She reports that she does not drink alcohol or use illicit drugs. family history includes Diabetes in her brother, mother, and sister. No Known Allergies Current Outpatient Prescriptions on File Prior to Visit  Medication Sig Dispense Refill  . aspirin EC 81 MG tablet Take 81 mg by mouth daily.    Marland Kitchen atorvastatin (LIPITOR) 40 MG tablet Take 1 tablet (40 mg total) by mouth daily. 90 tablet 3  . lisinopril-hydrochlorothiazide (PRINZIDE,ZESTORETIC) 20-25 MG tablet Take 1 tablet by mouth daily. 90 tablet 3  . metFORMIN (GLUCOPHAGE-XR) 500 MG 24 hr tablet Take 2 tablet by mouth daily in the AM 180 tablet 3  . propranolol (INDERAL) 10 MG tablet Take 1 tablet (10 mg total) by mouth 2 (two) times daily. 60 tablet 3   No current facility-administered medications on file prior to visit.    Review of Systems  Constitutional: Negative for unusual diaphoresis or night sweats HENT: Negative for ear swelling or discharge Eyes: Negative for worsening visual haziness  Respiratory: Negative for choking and  stridor.   Gastrointestinal: Negative for distension or worsening eructation Genitourinary: Negative for retention or change in urine volume.  Musculoskeletal: Negative for other MSK pain or swelling Skin: Negative for color change and worsening wound Neurological: Negative for tremors and numbness other than noted  Psychiatric/Behavioral: Negative for decreased concentration or agitation other than above       Objective:   Physical Exam BP 120/76 mmHg  Pulse 72  Temp(Src) 98.3 F (36.8 C) (Oral)  Resp 20  Wt 163 lb (73.936 kg)  SpO2 93%. VS noted,  Constitutional: Pt appears in no apparent  distress HENT: Head: NCAT.  Right Ear: External ear normal.  Left Ear: External ear normal.  Eyes: . Pupils are equal, round, and reactive to light. Conjunctivae and EOM are normal Neck: Normal range of motion. Neck supple.  Cardiovascular: Normal rate and regular rhythm.   Pulmonary/Chest: Effort normal and breath sounds without rales or wheezing.  Abd:  Soft, NT, ND, + BS Neurological: Pt is alert. Not confused , motor grossly intact Skin: Skin is warm. No rash, no LE edema Psychiatric: Pt behavior is normal. No agitation.  Right shoulder FROM, has some very mild diffuse nondiscrete tender/swelling at the beginning of the medial third of the right clavice, sternoclav joins show minimal bony changes, no tender    Assessment & Plan:

## 2015-11-15 NOTE — Assessment & Plan Note (Addendum)
stable overall by history and exam, recent data reviewed with pt, and pt to continue medical treatment as before except to take 2 metformin per day (not 1),  to f/u any worsening symptoms or concerns Lab Results  Component Value Date   HGBA1C 8.2* 10/01/2015

## 2015-11-15 NOTE — Assessment & Plan Note (Signed)
Stable, for cont'd pain control, refer to new pain management

## 2015-11-15 NOTE — Assessment & Plan Note (Signed)
Mild, ? Recent fx, declines film, for pain control as is,  to f/u any worsening symptoms or concerns

## 2015-11-15 NOTE — Assessment & Plan Note (Signed)
stable overall by history and exam, recent data reviewed with pt, and pt to continue medical treatment as before,  to f/u any worsening symptoms or concerns' BP Readings from Last 3 Encounters:  11/12/15 120/76  10/01/15 138/80  05/01/15 122/76

## 2015-11-21 DIAGNOSIS — M5432 Sciatica, left side: Secondary | ICD-10-CM | POA: Diagnosis not present

## 2015-11-21 DIAGNOSIS — G894 Chronic pain syndrome: Secondary | ICD-10-CM | POA: Diagnosis not present

## 2015-11-21 DIAGNOSIS — M5441 Lumbago with sciatica, right side: Secondary | ICD-10-CM | POA: Diagnosis not present

## 2015-11-21 DIAGNOSIS — M5431 Sciatica, right side: Secondary | ICD-10-CM | POA: Diagnosis not present

## 2015-11-21 DIAGNOSIS — Z79891 Long term (current) use of opiate analgesic: Secondary | ICD-10-CM | POA: Diagnosis not present

## 2015-11-21 DIAGNOSIS — M5137 Other intervertebral disc degeneration, lumbosacral region: Secondary | ICD-10-CM | POA: Diagnosis not present

## 2015-11-30 DIAGNOSIS — Z79891 Long term (current) use of opiate analgesic: Secondary | ICD-10-CM | POA: Diagnosis not present

## 2015-12-10 DIAGNOSIS — F4320 Adjustment disorder, unspecified: Secondary | ICD-10-CM | POA: Diagnosis not present

## 2015-12-16 DIAGNOSIS — Z79891 Long term (current) use of opiate analgesic: Secondary | ICD-10-CM | POA: Diagnosis not present

## 2015-12-17 ENCOUNTER — Ambulatory Visit: Payer: Medicare Other | Admitting: Physical Therapy

## 2015-12-24 ENCOUNTER — Telehealth: Payer: Self-pay

## 2015-12-24 NOTE — Telephone Encounter (Signed)
Pt cxl PT eval on 7/20, spoke with patient on 7/27 and [place]. Says she will call in a few weeks to schedule

## 2015-12-30 ENCOUNTER — Telehealth: Payer: Self-pay | Admitting: *Deleted

## 2015-12-30 NOTE — Telephone Encounter (Signed)
Routine refill to Valerie Wade

## 2015-12-30 NOTE — Telephone Encounter (Signed)
Left msg on triage requesting refill on her meloxicam 15mg . Med is not on med list pls advise on refill...Johny Chess

## 2015-12-31 MED ORDER — MELOXICAM 15 MG PO TABS: 15.0000 mg | ORAL_TABLET | Freq: Every day | ORAL | 0 refills | Status: DC

## 2015-12-31 NOTE — Telephone Encounter (Signed)
Refill sent to [facility]...Valerie Wade

## 2016-03-17 ENCOUNTER — Other Ambulatory Visit: Payer: Self-pay | Admitting: Internal Medicine

## 2016-03-22 ENCOUNTER — Other Ambulatory Visit: Payer: Self-pay | Admitting: Internal Medicine

## 2016-06-02 ENCOUNTER — Ambulatory Visit (INDEPENDENT_AMBULATORY_CARE_PROVIDER_SITE_OTHER): Payer: Medicare Other | Admitting: Internal Medicine

## 2016-06-02 ENCOUNTER — Encounter: Payer: Self-pay | Admitting: Internal Medicine

## 2016-06-02 VITALS — BP 136/74 | HR 84 | Resp 20 | Wt 167.0 lb

## 2016-06-02 DIAGNOSIS — M79644 Pain in right finger(s): Secondary | ICD-10-CM | POA: Diagnosis not present

## 2016-06-02 DIAGNOSIS — K0889 Other specified disorders of teeth and supporting structures: Secondary | ICD-10-CM | POA: Diagnosis not present

## 2016-06-02 DIAGNOSIS — E119 Type 2 diabetes mellitus without complications: Secondary | ICD-10-CM | POA: Diagnosis not present

## 2016-06-02 DIAGNOSIS — I10 Essential (primary) hypertension: Secondary | ICD-10-CM

## 2016-06-02 MED ORDER — DICLOFENAC SODIUM 1 % TD GEL: 4.0000 g | Freq: Four times a day (QID) | TRANSDERMAL | 11 refills | Status: DC | PRN

## 2016-06-02 NOTE — Progress Notes (Signed)
Pre visit review using our clinic review tool, if applicable. No additional management support is needed unless otherwise documented below in the visit note. 

## 2016-06-02 NOTE — Patient Instructions (Signed)
Please take all new medication as prescribed - the anti-inflammatory pain gel for the right thumb  Please continue all other medications as before  Please have the pharmacy call with any other refills you may need.  Please keep your appointments with your specialists as you may have planned

## 2016-06-02 NOTE — Progress Notes (Signed)
Subjective:    Patient ID: Valerie Wade, female    DOB: 08-08-1945, 71 y.o.   MRN: [id number]  HPI  Here to f/u with 2 days hx of right upper tooth pain but after making appt yesterday, pain is resolved today. Pt denies chest pain, increased sob or doe, wheezing, orthopnea, PND, increased LE swelling, palpitations, dizziness or syncope.  Pt denies new neurological symptoms such as new headache, or facial or extremity weakness or numbness   Pt denies polydipsia, polyuria.  Does have mild to mod right thumb DJD related pain, achy, intermittent, without swelling  No other new history Past Medical History:  Diagnosis Date  . Arthritis    blat thumb cmc  . Depression   . Diabetes mellitus without complication ([place])   . High cholesterol   . Hypertension   . Hyperthyroidism   . Left thyroid nodule 10/01/2015  . Tinnitus of left ear    Past Surgical History:  Procedure Laterality Date  . APPENDECTOMY    . KNEE SURGERY Left    meniscal tear    reports that she has been smoking.  She does not have any smokeless tobacco history on file. She reports that she does not drink alcohol or use drugs. family history includes Diabetes in her brother, mother, and sister. No Known Allergies Current Outpatient Prescriptions on File Prior to Visit  Medication Sig Dispense Refill  . aspirin EC 81 MG tablet Take 81 mg by mouth daily.    Marland Kitchen atorvastatin (LIPITOR) 40 MG tablet Take 1 tablet (40 mg total) by mouth daily. 90 tablet 3  . HYDROcodone-acetaminophen (NORCO) 7.5-325 MG tablet Take 1 tablet by mouth every 6 (six) hours as needed for moderate pain. To fill Oct 12, 2015 40 tablet 0  . lisinopril-hydrochlorothiazide (PRINZIDE,ZESTORETIC) 20-25 MG tablet TAKE ONE TABLET BY MOUTH ONCE DAILY 90 tablet 3  . meloxicam (MOBIC) 15 MG tablet TAKE ONE TABLET BY MOUTH ONCE DAILY 90 tablet 0  . metFORMIN (GLUCOPHAGE-XR) 500 MG 24 hr tablet Take 2 tablet by mouth daily in the AM 180 tablet 3  . propranolol  (INDERAL) 10 MG tablet TAKE ONE TABLET BY MOUTH TWICE DAILY 60 tablet 3   No current facility-administered medications on file prior to visit.    Review of Systems  Constitutional: Negative for unusual diaphoresis or night sweats HENT: Negative for ear swelling or discharge Eyes: Negative for worsening visual haziness  Respiratory: Negative for choking and stridor.   Gastrointestinal: Negative for distension or worsening eructation Genitourinary: Negative for retention or change in urine volume.  Musculoskeletal: Negative for other MSK pain or swelling Skin: Negative for color change and worsening wound Neurological: Negative for tremors and numbness other than noted  Psychiatric/Behavioral: Negative for decreased concentration or agitation other than above   All other system neg per pt    Objective:   Physical Exam BP 136/74   Pulse 84   Resp 20   Wt 167 lb (75.8 kg)   SpO2 97%   BMI 26.16 kg/m  VS noted,  Constitutional: Pt appears in no apparent distress HENT: Head: NCAT.  Right Ear: External ear normal.  Left Ear: External ear normal.  Eyes: . Pupils are equal, round, and reactive to light. Conjunctivae and EOM are normal Neck: Normal range of motion. Neck supple.  Cardiovascular: Normal rate and regular rhythm.   Pulmonary/Chest: Effort normal and breath sounds without rales or wheezing.  Right thumb CMC degenerative change Neurological: Pt is alert. Not confused ,  motor grossly intact Skin: Skin is warm. No rash, no LE edema Psychiatric: Pt behavior is normal. No agitation.  No other new exam findings    Assessment & Plan:

## 2016-06-04 NOTE — Assessment & Plan Note (Signed)
Mild to mod, for voltaren gel prn,  to f/u any worsening symptoms or concerns  

## 2016-06-04 NOTE — Assessment & Plan Note (Signed)
stable overall by history and exam, recent data reviewed with pt, and pt to continue medical treatment as before,  to f/u any worsening symptoms or concerns BP Readings from Last 3 Encounters:  06/02/16 136/74  11/12/15 120/76  10/01/15 138/80

## 2016-06-04 NOTE — Assessment & Plan Note (Signed)
Exam benign, symptoms resolved, no need further tx

## 2016-06-04 NOTE — Assessment & Plan Note (Signed)
Lab Results  Component Value Date   HGBA1C 8.2 (H) 10/01/2015   stable overall by history and exam, recent data reviewed with pt, and pt to continue medical treatment as before,  to f/u any worsening symptoms or concerns, declines f/u lab today

## 2016-06-14 ENCOUNTER — Other Ambulatory Visit: Payer: Medicare Other

## 2016-06-14 ENCOUNTER — Encounter: Payer: Self-pay | Admitting: Internal Medicine

## 2016-06-14 ENCOUNTER — Ambulatory Visit (INDEPENDENT_AMBULATORY_CARE_PROVIDER_SITE_OTHER)
Admission: RE | Admit: 2016-06-14 | Discharge: 2016-06-14 | Disposition: A | Discharge status: Home / Self Care | Payer: Medicare Other | Source: Ambulatory Visit | Attending: Internal Medicine | Admitting: Internal Medicine

## 2016-06-14 ENCOUNTER — Ambulatory Visit (INDEPENDENT_AMBULATORY_CARE_PROVIDER_SITE_OTHER): Payer: Medicare Other | Admitting: Internal Medicine

## 2016-06-14 VITALS — BP 140/80 | HR 66 | Temp 98.0°F | Resp 20 | Wt 167.0 lb

## 2016-06-14 DIAGNOSIS — Z0000 Encounter for general adult medical examination without abnormal findings: Secondary | ICD-10-CM

## 2016-06-14 DIAGNOSIS — M546 Pain in thoracic spine: Secondary | ICD-10-CM | POA: Diagnosis not present

## 2016-06-14 DIAGNOSIS — S3992XA Unspecified injury of lower back, initial encounter: Secondary | ICD-10-CM | POA: Diagnosis not present

## 2016-06-14 DIAGNOSIS — I10 Essential (primary) hypertension: Secondary | ICD-10-CM | POA: Diagnosis not present

## 2016-06-14 DIAGNOSIS — M549 Dorsalgia, unspecified: Secondary | ICD-10-CM

## 2016-06-14 DIAGNOSIS — E7800 Pure hypercholesterolemia, unspecified: Secondary | ICD-10-CM | POA: Diagnosis not present

## 2016-06-14 DIAGNOSIS — Z1211 Encounter for screening for malignant neoplasm of colon: Secondary | ICD-10-CM

## 2016-06-14 DIAGNOSIS — E119 Type 2 diabetes mellitus without complications: Secondary | ICD-10-CM

## 2016-06-14 DIAGNOSIS — M545 Low back pain: Secondary | ICD-10-CM | POA: Diagnosis not present

## 2016-06-14 DIAGNOSIS — E2839 Other primary ovarian failure: Secondary | ICD-10-CM

## 2016-06-14 MED ORDER — TIZANIDINE HCL 4 MG PO TABS: 4.0000 mg | ORAL_TABLET | Freq: Four times a day (QID) | ORAL | 2 refills | Status: DC | PRN

## 2016-06-14 MED ORDER — DICLOFENAC SODIUM 1 % TD GEL: 4.0000 g | Freq: Four times a day (QID) | TRANSDERMAL | 11 refills | Status: DC | PRN

## 2016-06-14 MED ORDER — HYDROCODONE-ACETAMINOPHEN 7.5-325 MG PO TABS: 1.0000 | ORAL_TABLET | Freq: Four times a day (QID) | ORAL | 0 refills | Status: DC | PRN

## 2016-06-14 NOTE — Progress Notes (Signed)
Pre visit review using our clinic review tool, if applicable. No additional management support is needed unless otherwise documented below in the visit note. 

## 2016-06-14 NOTE — Progress Notes (Signed)
Subjective:    Patient ID: Valerie Wade, female    DOB: 01/31/1946, 71 y.o.   MRN: [id number]  HPI  Here to f/u with c/o recent fall with aggrevation it seems of 2 days onset mod to severe achy dull and sharp back pain primarily of the upper lumbar/thoracic area area midline but also aching milder throughout; missed a couple of stairs at the bottom of the stairs for some reason; fell to left hip and side on hard surface but has only mild sore spots without bruising, swelling to hip and left lateral chest. Back pain worse with any movement, standing up or walking, but Pt denies bowel or bladder change, fever, wt loss,  worsening LE pain/numbness/weakness, gait change or other falls.  Has not been back to pain management, not taken narcotic x 6 mo, does not plan to return, just putting up with the chronic pain.  Also, pt denies chest pain, increasing sob or doe, wheezing, orthopnea, PND, increased LE swelling, palpitations, dizziness or syncope.  Pt denies new neurological symptoms such as new headache, or facial or extremity weakness or numbness.  Pt denies polydipsia, polyuria, or low sugar episode.    Pt states overall good compliance with meds, mostly trying to follow appropriate diet, with wt overall stable.  Wt Readings from Last 3 Encounters:  06/14/16 167 lb (75.8 kg)  06/02/16 167 lb (75.8 kg)  11/12/15 163 lb (73.9 kg)   BP Readings from Last 3 Encounters:  06/14/16 140/80  06/02/16 136/74  11/12/15 120/76  Admits to forgetting many med doses and dietary indiscretions.   Declines pneumovax today and tetanus.  OK for colonoscopy and DXA. Past Medical History:  Diagnosis Date  . Arthritis    blat thumb cmc  . Depression   . Diabetes mellitus without complication ([place])   . High cholesterol   . Hypertension   . Hyperthyroidism   . Left thyroid nodule 10/01/2015  . Tinnitus of left ear    Past Surgical History:  Procedure Laterality Date  . APPENDECTOMY    . KNEE SURGERY Left      meniscal tear    reports that she has been smoking.  She does not have any smokeless tobacco history on file. She reports that she does not drink alcohol or use drugs. family history includes Diabetes in her brother, mother, and sister. No Known Allergies Current Outpatient Prescriptions on File Prior to Visit  Medication Sig Dispense Refill  . aspirin EC 81 MG tablet Take 81 mg by mouth daily.    Marland Kitchen atorvastatin (LIPITOR) 40 MG tablet Take 1 tablet (40 mg total) by mouth daily. 90 tablet 3  . lisinopril-hydrochlorothiazide (PRINZIDE,ZESTORETIC) 20-25 MG tablet TAKE ONE TABLET BY MOUTH ONCE DAILY 90 tablet 3  . meloxicam (MOBIC) 15 MG tablet TAKE ONE TABLET BY MOUTH ONCE DAILY 90 tablet 0  . metFORMIN (GLUCOPHAGE-XR) 500 MG 24 hr tablet Take 2 tablet by mouth daily in the AM 180 tablet 3  . propranolol (INDERAL) 10 MG tablet TAKE ONE TABLET BY MOUTH TWICE DAILY 60 tablet 3   No current facility-administered medications on file prior to visit.     Review of Systems  Constitutional: Negative for unusual diaphoresis or night sweats HENT: Negative for ear swelling or discharge Eyes: Negative for worsening visual haziness  Respiratory: Negative for choking and stridor.   Gastrointestinal: Negative for distension or worsening eructation Genitourinary: Negative for retention or change in urine volume.  Musculoskeletal: Negative for other MSK pain  or swelling Skin: Negative for color change and worsening wound Neurological: Negative for tremors and numbness other than noted  Psychiatric/Behavioral: Negative for decreased concentration or agitation other than above   All other system neg per pt    Objective:   Physical Exam BP 140/80   Pulse 66   Temp 98 F (36.7 C) (Oral)   Resp 20   Wt 167 lb (75.8 kg)   SpO2 97%   BMI 26.16 kg/m  VS noted, not ill appearing Constitutional: Pt appears in no apparent distress HENT: Head: NCAT.  Right Ear: External ear normal.  Left Ear:  External ear normal.  Eyes: . Pupils are equal, round, and reactive to light. Conjunctivae and EOM are normal Neck: Normal range of motion. Neck supple.  Cardiovascular: Normal rate and regular rhythm.   Pulmonary/Chest: Effort normal and breath sounds without rales or wheezing.  Abd:  Soft, NT, ND, + BS Spine:  Tender upper lumbar/lower thoracic in midline without swelling, erythema, rash Neurological: Pt is alert. Not confused , motor 5/5 intact Skin: Skin is warm. No rash, no LE edema Psychiatric: Pt behavior is normal. No agitation. mild nervous No other new exam findings    Assessment & Plan:

## 2016-06-14 NOTE — Patient Instructions (Addendum)
You will be contacted regarding the referral for: GYN ([place])  You will be contacted regarding the referral for: colonoscopy  Please schedule the bone density test before leaving today at the scheduling desk (where you check out)  Please take all new medication as prescribed - the hydrocodone, and the muscle relaxer  Please continue all other medications as before   Please have the pharmacy call with any other refills you may need.  Please continue your efforts at being more active, low cholesterol diet, and weight control.  You are otherwise up to date with prevention measures today.  Please keep your appointments with your specialists as you may have planned  Please go to the XRAY Department in the Basement (go straight as you get off the elevator) for the x-ray testing  Please go to the LAB in the Basement (turn left off the elevator) for the tests to be done today  You will be contacted by phone if any changes need to be made immediately.  Otherwise, you will receive a letter about your results with an explanation, but please check with MyChart first.  Please remember to sign up for MyChart if you have not done so, as this will be important to you in the future with finding out test results, communicating by private email, and scheduling acute appointments online when needed.  Please return in 6 months, or sooner if needed

## 2016-06-15 ENCOUNTER — Encounter: Payer: Medicare Other | Admitting: Internal Medicine

## 2016-06-15 NOTE — Assessment & Plan Note (Signed)
stable overall by history and exam, recent data reviewed with pt, and pt to continue medical treatment as before,  to f/u any worsening symptoms or concerns BP Readings from Last 3 Encounters:  06/14/16 140/80  06/02/16 136/74  11/12/15 120/76

## 2016-06-15 NOTE — Assessment & Plan Note (Signed)
stable overall by history and exam, recent data reviewed with pt, and pt to continue medical treatment as before,  to f/u any worsening symptoms or concerns Lab Results  Component Value Date   LDLCALC 113 (H) 10/01/2015

## 2016-06-15 NOTE — Assessment & Plan Note (Signed)
Suspect aggrevation of msk spinal pain and underlying lumbar/thoracic djd/ddd, no neuro changes, cant r/o other such as compression fx related to estrogen deficiency for ls spine and t spine films to r/o fx, for vicodin prn acute pain only, muscle relaxer prn, consider ortho or sport medicine f/u

## 2016-06-15 NOTE — Assessment & Plan Note (Signed)
Lab Results  Component Value Date   HGBA1C 8.2 (H) 10/01/2015   Stable, for f/u labs today

## 2016-06-17 ENCOUNTER — Encounter: Payer: Self-pay | Admitting: Internal Medicine

## 2016-06-22 ENCOUNTER — Telehealth: Payer: Self-pay | Admitting: Internal Medicine

## 2016-06-22 ENCOUNTER — Ambulatory Visit (INDEPENDENT_AMBULATORY_CARE_PROVIDER_SITE_OTHER)
Admission: RE | Admit: 2016-06-22 | Discharge: 2016-06-22 | Disposition: A | Discharge status: Home / Self Care | Payer: Medicare Other | Source: Ambulatory Visit | Attending: Internal Medicine | Admitting: Internal Medicine

## 2016-06-22 DIAGNOSIS — E2839 Other primary ovarian failure: Secondary | ICD-10-CM

## 2016-06-22 NOTE — Telephone Encounter (Signed)
Pt called in said that she need her sugar  supplies ?  Pt needs them sent into [facility] number [phone]

## 2016-06-22 NOTE — Telephone Encounter (Signed)
Patient Name: Valerie Wade  DOB: [birth year]    Initial Comment Caller states she has accidentally taken an extra dose of lisinopril, propanolol, and metformin   Nurse Assessment  Nurse: Julien Girt, RN, Almyra Free Date/Time Eilene Ghazi Time): 06/22/2016 12:57:59 PM  Confirm and document reason for call. If symptomatic, describe symptoms. ---Caller states she has accidentally taken an extra dose of lisinopril , propanolol, and metformin 30 mins ago. She does not have a BP monitor, BS is 231.  Does the patient have any new or worsening symptoms? ---Yes  Will a triage be completed? ---Yes  Related visit to physician within the last 2 weeks? ---No  Does the PT have any chronic conditions? (i.e. diabetes, asthma, etc.) ---Yes  List chronic conditions. ---Htn, High Cholesterol, Type 2 Diabetes  Is this a behavioral health or substance abuse call? ---No     Guidelines    Guideline Title Affirmed Question Affirmed Notes  Poisoning All OTHER POTENTIALLY HARMFUL SUBSTANCES (e.g., nearly all chemicals, plants, more than a double dose of a drug)    Final Disposition User   Call [facility] [place], RN, Almyra Free    Comments  Spoke to caller, she called poison control and was advised just to monitor for sx at this time: dizziness, light headed or weakness. She verbalized understanding of all instructions and information and will cb as needed.   Disagree/Comply: Comply

## 2016-06-23 NOTE — Telephone Encounter (Signed)
Called patient asked to give [place] a call back regarding what type of meter she is needing so that we will know what kind of strips and lancets to send in.

## 2016-06-23 NOTE — Telephone Encounter (Signed)
Ok for corinne to send appropriate strips  And lancets but will likely need to know the type of glucometer that she already has

## 2016-06-27 ENCOUNTER — Other Ambulatory Visit: Payer: Self-pay | Admitting: Internal Medicine

## 2016-06-27 DIAGNOSIS — Z1231 Encounter for screening mammogram for malignant neoplasm of breast: Secondary | ICD-10-CM

## 2016-06-28 ENCOUNTER — Ambulatory Visit (INDEPENDENT_AMBULATORY_CARE_PROVIDER_SITE_OTHER): Payer: Medicare Other | Admitting: Internal Medicine

## 2016-06-28 DIAGNOSIS — E119 Type 2 diabetes mellitus without complications: Secondary | ICD-10-CM

## 2016-06-28 DIAGNOSIS — E7800 Pure hypercholesterolemia, unspecified: Secondary | ICD-10-CM

## 2016-06-28 DIAGNOSIS — I10 Essential (primary) hypertension: Secondary | ICD-10-CM

## 2016-06-28 DIAGNOSIS — I700 Atherosclerosis of aorta: Secondary | ICD-10-CM | POA: Insufficient documentation

## 2016-06-28 MED ORDER — LANCETS MISC: 3 refills | Status: AC

## 2016-06-28 MED ORDER — GLUCOSE BLOOD VI STRP: ORAL_STRIP | 12 refills | Status: DC

## 2016-06-28 NOTE — Progress Notes (Signed)
Pre visit review using our clinic review tool, if applicable. No additional management support is needed unless otherwise documented below in the visit note. 

## 2016-06-28 NOTE — Assessment & Plan Note (Signed)
stable overall by history and exam, recent data reviewed with pt, and pt to continue medical treatment as before,  to f/u any worsening symptoms or concerns Lab Results  Component Value Date   HGBA1C 8.2 (H) 10/01/2015

## 2016-06-28 NOTE — Assessment & Plan Note (Signed)
stable overall by history and exam, recent data reviewed with pt, and pt to continue medical treatment as before,  to f/u any worsening symptoms or concerns BP Readings from Last 3 Encounters:  06/28/16 136/78  06/14/16 140/80  06/02/16 136/74

## 2016-06-28 NOTE — Progress Notes (Signed)
Subjective:    Patient ID: Valerie Wade, female    DOB: 14-Feb-1946, 71 y.o.   MRN: [id number]  HPI  Here to f/u back pain, seen jan 16, with plain films LS spine and thoracic done, then dxa jan 24. Had noted atherosclerotic vascular disease mentioned, and pt states has several family members with cardovascular disease.  Does take the ASA and statin.  Mother with inoperabel AAA, killed her. Has been fatigued and tired, not really depressed, but more nervous lately, Pt denies chest pain, increased sob or doe, wheezing, orthopnea, PND, increased LE swelling, palpitations, dizziness or syncope.  No specific claudication type symptoms.   + smoker Past Medical History:  Diagnosis Date  . Arthritis    blat thumb cmc  . Depression   . Diabetes mellitus without complication ([place])   . High cholesterol   . Hypertension   . Hyperthyroidism   . Left thyroid nodule 10/01/2015  . Tinnitus of left ear    Past Surgical History:  Procedure Laterality Date  . APPENDECTOMY    . KNEE SURGERY Left    meniscal tear    reports that she has been smoking.  She does not have any smokeless tobacco history on file. She reports that she does not drink alcohol or use drugs. family history includes Diabetes in her brother, mother, and sister. No Known Allergies Current Outpatient Prescriptions on File Prior to Visit  Medication Sig Dispense Refill  . aspirin EC 81 MG tablet Take 81 mg by mouth daily.    Marland Kitchen atorvastatin (LIPITOR) 40 MG tablet Take 1 tablet (40 mg total) by mouth daily. 90 tablet 3  . diclofenac sodium (VOLTAREN) 1 % GEL Apply 4 g topically 4 (four) times daily as needed. 400 g 11  . HYDROcodone-acetaminophen (NORCO) 7.5-325 MG tablet Take 1 tablet by mouth every 6 (six) hours as needed for moderate pain. 60 tablet 0  . lisinopril-hydrochlorothiazide (PRINZIDE,ZESTORETIC) 20-25 MG tablet TAKE ONE TABLET BY MOUTH ONCE DAILY 90 tablet 3  . meloxicam (MOBIC) 15 MG tablet TAKE ONE TABLET BY MOUTH  ONCE DAILY 90 tablet 0  . metFORMIN (GLUCOPHAGE-XR) 500 MG 24 hr tablet Take 2 tablet by mouth daily in the AM 180 tablet 3  . propranolol (INDERAL) 10 MG tablet TAKE ONE TABLET BY MOUTH TWICE DAILY 60 tablet 3  . tiZANidine (ZANAFLEX) 4 MG tablet Take 1 tablet (4 mg total) by mouth every 6 (six) hours as needed for muscle spasms. 30 tablet 2   No current facility-administered medications on file prior to visit.    Review of Systems  Constitutional: Negative for unusual diaphoresis or night sweats HENT: Negative for ear swelling or discharge Eyes: Negative for worsening visual haziness  Respiratory: Negative for choking and stridor.   Gastrointestinal: Negative for distension or worsening eructation Genitourinary: Negative for retention or change in urine volume.  Musculoskeletal: Negative for other MSK pain or swelling Skin: Negative for color change and worsening wound Neurological: Negative for tremors and numbness other than noted  Psychiatric/Behavioral: Negative for decreased concentration or agitation other than above   All other system neg per pt    Objective:   Physical Exam BP 136/78   Pulse 75   Temp 98.2 F (36.8 C) (Oral)   Resp 20   Wt 166 lb (75.3 kg)   SpO2 95%   BMI 26.00 kg/m  VS noted,  Constitutional: Pt appears in no apparent distress HENT: Head: NCAT.  Right Ear: External ear normal.  Left Ear: External ear normal.  Eyes: . Pupils are equal, round, and reactive to light. Conjunctivae and EOM are normal Neck: Normal range of motion. Neck supple.  Cardiovascular: Normal rate and regular rhythm.   Pulmonary/Chest: Effort normal and breath sounds without rales or wheezing.  Abd:  Soft, NT, ND, + BS Neurological: Pt is alert. Not confused , motor grossly intact Skin: Skin is warm. No rash, no LE edema Psychiatric: Pt behavior is normal. No agitation.  Dorsalis pedis trace to 1+ bilat   06/14/2016 THORACIC SPINE 2 VIEWS IMPRESSION: 1. Mild thoracic  spondylosis and degenerative disc disease with 11 degrees of dextroconvex thoracic scoliosis. No acute findings. 2. There is evidence of considerable spondylosis and degenerative disc disease at C5-6 and C6-7.  LUMBAR SPINE - COMPLETE 4+ VIEW 06/14/2016 IMPRESSION: 1. Lumbar spondylosis and degenerative disc disease, severe at L4-5. No acute findings. 2.  Aortoiliac atherosclerotic vascular disease.       Assessment & Plan:

## 2016-06-28 NOTE — Patient Instructions (Signed)
You will be contacted regarding the referral for: Aorta ultrasound  You will be contacted regarding the referral for: cologuard (materials to be sent to your home)  Please continue all other medications as before, and refills have been done if requested.  Please have the pharmacy call with any other refills you may need.  Please continue your efforts at being more active, low cholesterol diet, and weight control.  Please keep your appointments with your specialists as you may have planned

## 2016-06-28 NOTE — Assessment & Plan Note (Signed)
stable overall by history and exam, recent data reviewed with pt, and pt to continue medical treatment as before,  to f/u any worsening symptoms or concerns Lab Results  Component Value Date   LDLCALC 113 (H) 10/01/2015

## 2016-06-28 NOTE — Assessment & Plan Note (Signed)
Ok for aaa duplex, r/o AAA/vascular dz

## 2016-06-29 ENCOUNTER — Encounter: Payer: Self-pay | Admitting: Internal Medicine

## 2016-06-30 ENCOUNTER — Other Ambulatory Visit: Payer: Self-pay | Admitting: *Deleted

## 2016-06-30 MED ORDER — GLUCOSE BLOOD VI STRP: 1.0000 | ORAL_STRIP | Freq: Two times a day (BID) | 3 refills | Status: DC

## 2016-06-30 MED ORDER — ACCU-CHEK AVIVA PLUS W/DEVICE KIT: PACK | 0 refills | Status: DC

## 2016-06-30 MED ORDER — ACCU-CHEK SOFTCLIX LANCETS MISC: 1.0000 | Freq: Two times a day (BID) | 3 refills | Status: DC

## 2016-06-30 NOTE — Telephone Encounter (Signed)
Rec'd fax stating pt insurance plan cover Accu-chek monitor/supplies not One Touch. Requesting rx for Accu-chk w/supples. Updated chart sent accu-chek monitor/supplies...Valerie Wade

## 2016-07-08 ENCOUNTER — Ambulatory Visit (INDEPENDENT_AMBULATORY_CARE_PROVIDER_SITE_OTHER): Payer: Medicare Other | Admitting: Gynecology

## 2016-07-08 ENCOUNTER — Encounter: Payer: Self-pay | Admitting: Gynecology

## 2016-07-08 VITALS — BP 124/78 | Ht 67.0 in | Wt 165.0 lb

## 2016-07-08 DIAGNOSIS — F17200 Nicotine dependence, unspecified, uncomplicated: Secondary | ICD-10-CM

## 2016-07-08 DIAGNOSIS — Z01419 Encounter for gynecological examination (general) (routine) without abnormal findings: Secondary | ICD-10-CM

## 2016-07-08 NOTE — Progress Notes (Signed)
Valerie Wade 02-Mar-1946 [id number]   History:    71 y.o.  for annual gyn exam who is a new patient to the practice. She has not had a gynecological examination in over 7 years. That was the last time she had a Pap smear. She reports no past history of any abnormal Pap smear. Patient with no past history of hormone replacement therapy. Patient has been a smoker since the age of 75 smokes close the phaco cigarette per day. She states her PCP has counseled her on numerous occasion. Patient declined flu vaccine to the fact that last year she became very ill after having received vaccine. Her PCP is been doing her blood work. She has not had a colonoscopy but her PCPs going to order her the [place]. Patient had a normal bone density study this year.  Past medical history,surgical history, family history and social history were all reviewed and documented in the EPIC chart.  Gynecologic History No LMP recorded. Patient is postmenopausal. Contraception: post menopausal status Last Pap: Over 7 years ago. Results were: normal Last mammogram: 2017. Results were: normal  Obstetric History OB History  Gravida Para Term Preterm AB Living  2         2  SAB TAB Ectopic Multiple Live Births               # Outcome Date GA Lbr Len/2nd Weight Sex Delivery Anes PTL Lv  2 Gravida           1 Gravida             Obstetric Comments  Declined to answer if she had abortions     ROS: A ROS was performed and pertinent positives and negatives are included in the history.  GENERAL: No fevers or chills. HEENT: No change in vision, no earache, sore throat or sinus congestion. NECK: No pain or stiffness. CARDIOVASCULAR: No chest pain or pressure. No palpitations. PULMONARY: No shortness of breath, cough or wheeze. GASTROINTESTINAL: No abdominal pain, nausea, vomiting or diarrhea, melena or bright red blood per rectum. GENITOURINARY: No urinary frequency, urgency, hesitancy or dysuria. MUSCULOSKELETAL: No  joint or muscle pain, no back pain, no recent trauma. DERMATOLOGIC: No rash, no itching, no lesions. ENDOCRINE: No polyuria, polydipsia, no heat or cold intolerance. No recent change in weight. HEMATOLOGICAL: No anemia or easy bruising or bleeding. NEUROLOGIC: No headache, seizures, numbness, tingling or weakness. PSYCHIATRIC: No depression, no loss of interest in normal activity or change in sleep pattern.     Exam: chaperone present  BP 124/78   Ht 5\' 7"  (1.702 m)   Wt 165 lb (74.8 kg)   BMI 25.84 kg/m   Body mass index is 25.84 kg/m.  General appearance : Well developed well nourished female. No acute distress HEENT: Eyes: no retinal hemorrhage or exudates,  Neck supple, trachea midline, no carotid bruits, no thyroidmegaly Lungs: Clear to auscultation, no rhonchi or wheezes, or rib retractions  Heart: Regular rate and rhythm, no murmurs or gallops Breast:Examined in sitting and supine position were symmetrical in appearance, no palpable masses or tenderness,  no skin retraction, no nipple inversion, no nipple discharge, no skin discoloration, no axillary or supraclavicular lymphadenopathy Abdomen: no palpable masses or tenderness, no rebound or guarding Extremities: no edema or skin discoloration or tenderness  Pelvic:  Bartholin, Urethra, Skene Glands: Within normal limits             Vagina: No gross lesions or discharge, atrophic changes  Cervix: No gross lesions or discharge  Uterus  anteverted, normal size, shape and consistency, non-tender and mobile  Adnexa  Without masses or tenderness  Anus and perineum  normal   Rectovaginal  normal sphincter tone without palpated masses or tenderness             Hemoccult PCP     Assessment/Plan:  71 y.o. female for annual exam had a Pap smear done today since we have no record from her last Pap smear just so we can have as part of her record. We did discuss the new guidelines. She was counseled on the detrimental effects of smoking.  We discussed importance of calcium vitamin D and weightbearing exercise for osteoporosis prevention. Her PCP has been doing her blood work and her vaccines are up-to-date with the exception of the flu vaccine which she declined.   Terrance Mass MD, 3:38 PM 07/08/2016

## 2016-07-08 NOTE — Addendum Note (Signed)
Addended by: Burnett Kanaris on: 07/08/2016 04:52 PM   Modules accepted: Orders

## 2016-07-08 NOTE — Patient Instructions (Signed)
Steps to Quit Smoking Smoking tobacco can be harmful to your health and can affect almost every organ in your body. Smoking puts you, and those around you, at risk for developing many serious chronic diseases. Quitting smoking is difficult, but it is one of the best things that you can do for your health. It is never too late to quit. What are the benefits of quitting smoking? When you quit smoking, you lower your risk of developing serious diseases and conditions, such as:  Lung cancer or lung disease, such as COPD.  Heart disease.  Stroke.  Heart attack.  Infertility.  Osteoporosis and bone fractures.  Additionally, symptoms such as coughing, wheezing, and shortness of breath may get better when you quit. You may also find that you get sick less often because your body is stronger at fighting off colds and infections. If you are pregnant, quitting smoking can help to reduce your chances of having a baby of low birth weight. How do I get ready to quit? When you decide to quit smoking, create a plan to make sure that you are successful. Before you quit:  Pick a date to quit. Set a date within the next two weeks to give you time to prepare.  Write down the reasons why you are quitting. Keep this list in places where you will see it often, such as on your bathroom mirror or in your car or wallet.  Identify the people, places, things, and activities that make you want to smoke (triggers) and avoid them. Make sure to take these actions: ? Throw away all cigarettes at home, at work, and in your car. ? Throw away smoking accessories, such as ashtrays and lighters. ? Clean your car and make sure to empty the ashtray. ? Clean your home, including curtains and carpets.  Tell your family, friends, and coworkers that you are quitting. Support from your loved ones can make quitting easier.  Talk with your health care provider about your options for quitting smoking.  Find out what treatment  options are covered by your health insurance.  What strategies can I use to quit smoking? Talk with your healthcare provider about different strategies to quit smoking. Some strategies include:  Quitting smoking altogether instead of gradually lessening how much you smoke over a period of time. Research shows that quitting "cold turkey" is more successful than gradually quitting.  Attending in-person counseling to help you build problem-solving skills. You are more likely to have success in quitting if you attend several counseling sessions. Even short sessions of 10 minutes can be effective.  Finding resources and support systems that can help you to quit smoking and remain smoke-free after you quit. These resources are most helpful when you use them often. They can include: ? Online chats with a counselor. ? Telephone quitlines. ? Printed self-help materials. ? Support groups or group counseling. ? Text messaging programs. ? Mobile phone applications.  Taking medicines to help you quit smoking. (If you are pregnant or breastfeeding, talk with your health care provider first.) Some medicines contain nicotine and some do not. Both types of medicines help with cravings, but the medicines that include nicotine help to relieve withdrawal symptoms. Your health care provider may recommend: ? Nicotine patches, gum, or lozenges. ? Nicotine inhalers or sprays. ? Non-nicotine medicine that is taken by mouth.  Talk with your health care provider about combining strategies, such as taking medicines while you are also receiving in-person counseling. Using these two strategies together   makes you more likely to succeed in quitting than if you used either strategy on its own. If you are pregnant or breastfeeding, talk with your health care provider about finding counseling or other support strategies to quit smoking. Do not take medicine to help you quit smoking unless told to do so by your health care  provider. What things can I do to make it easier to quit? Quitting smoking might feel overwhelming at first, but there is a lot that you can do to make it easier. Take these important actions:  Reach out to your family and friends and ask that they support and encourage you during this time. Call telephone quitlines, reach out to support groups, or work with a counselor for support.  Ask people who smoke to avoid smoking around you.  Avoid places that trigger you to smoke, such as bars, parties, or smoke-break areas at work.  Spend time around people who do not smoke.  Lessen stress in your life, because stress can be a smoking trigger for some people. To lessen stress, try: ? Exercising regularly. ? Deep-breathing exercises. ? Yoga. ? Meditating. ? Performing a body scan. This involves closing your eyes, scanning your body from head to toe, and noticing which parts of your body are particularly tense. Purposefully relax the muscles in those areas.  Download or purchase mobile phone or tablet apps (applications) that can help you stick to your quit plan by providing reminders, tips, and encouragement. There are many free apps, such as QuitGuide from the CDC (Centers for Disease Control and Prevention). You can find other support for quitting smoking (smoking cessation) through smokefree.gov and other websites.  How will I feel when I quit smoking? Within the first 24 hours of quitting smoking, you may start to feel some withdrawal symptoms. These symptoms are usually most noticeable 2-3 days after quitting, but they usually do not last beyond 2-3 weeks. Changes or symptoms that you might experience include:  Mood swings.  Restlessness, anxiety, or irritation.  Difficulty concentrating.  Dizziness.  Strong cravings for sugary foods in addition to nicotine.  Mild weight gain.  Constipation.  Nausea.  Coughing or a sore throat.  Changes in how your medicines work in your  body.  A depressed mood.  Difficulty sleeping (insomnia).  After the first 2-3 weeks of quitting, you may start to notice more positive results, such as:  Improved sense of smell and taste.  Decreased coughing and sore throat.  Slower heart rate.  Lower blood pressure.  Clearer skin.  The ability to breathe more easily.  Fewer sick days.  Quitting smoking is very challenging for most people. Do not get discouraged if you are not successful the first time. Some people need to make many attempts to quit before they achieve long-term success. Do your best to stick to your quit plan, and talk with your health care provider if you have any questions or concerns. This information is not intended to replace advice given to you by your health care provider. Make sure you discuss any questions you have with your health care provider. Document Released: 05/10/2001 Document Revised: 01/12/2016 Document Reviewed: 09/30/2014 Elsevier Interactive Patient Education  2017 [employer]  

## 2016-07-12 LAB — PAP IG (IMAGE GUIDED)

## 2016-07-18 ENCOUNTER — Other Ambulatory Visit (INDEPENDENT_AMBULATORY_CARE_PROVIDER_SITE_OTHER): Payer: Medicare Other

## 2016-07-18 DIAGNOSIS — E7800 Pure hypercholesterolemia, unspecified: Secondary | ICD-10-CM | POA: Diagnosis not present

## 2016-07-18 DIAGNOSIS — E119 Type 2 diabetes mellitus without complications: Secondary | ICD-10-CM

## 2016-07-18 LAB — URINALYSIS, ROUTINE W REFLEX MICROSCOPIC
Bilirubin Urine: NEGATIVE
Hgb urine dipstick: NEGATIVE
Ketones, ur: NEGATIVE
Nitrite: NEGATIVE
PH: 5.5 (ref 5.0–8.0)
SPECIFIC GRAVITY, URINE: 1.025 (ref 1.000–1.030)
Total Protein, Urine: NEGATIVE
Urine Glucose: NEGATIVE
Urobilinogen, UA: 0.2 (ref 0.0–1.0)

## 2016-07-18 LAB — CBC WITH DIFFERENTIAL/PLATELET
Basophils Absolute: 0.1 10*3/uL (ref 0.0–0.1)
Basophils Relative: 0.9 % (ref 0.0–3.0)
EOS ABS: 0.1 10*3/uL (ref 0.0–0.7)
EOS PCT: 1.5 % (ref 0.0–5.0)
HCT: 41.1 % (ref 36.0–46.0)
Hemoglobin: 14.4 g/dL (ref 12.0–15.0)
LYMPHS ABS: 3.3 10*3/uL (ref 0.7–4.0)
Lymphocytes Relative: 34.3 % (ref 12.0–46.0)
MCHC: 35 g/dL (ref 30.0–36.0)
MCV: 89.4 fl (ref 78.0–100.0)
MONO ABS: 0.8 10*3/uL (ref 0.1–1.0)
Monocytes Relative: 7.9 % (ref 3.0–12.0)
NEUTROS PCT: 55.4 % (ref 43.0–77.0)
Neutro Abs: 5.4 10*3/uL (ref 1.4–7.7)
Platelets: 394 10*3/uL (ref 150.0–400.0)
RBC: 4.61 Mil/uL (ref 3.87–5.11)
RDW: 12.6 % (ref 11.5–15.5)
WBC: 9.7 10*3/uL (ref 4.0–10.5)

## 2016-07-18 LAB — LIPID PANEL
Cholesterol: 142 mg/dL (ref 0–200)
HDL: 38.5 mg/dL — AB (ref >39.00)
LDL CALC: 77 mg/dL (ref 0–99)
NONHDL: 103.73
TRIGLYCERIDES: 134 mg/dL (ref 0.0–149.0)
Total CHOL/HDL Ratio: 4
VLDL: 26.8 mg/dL (ref 0.0–40.0)

## 2016-07-18 LAB — HEPATIC FUNCTION PANEL
ALBUMIN: 4.2 g/dL (ref 3.5–5.2)
ALK PHOS: 69 U/L (ref 39–117)
ALT: 18 U/L (ref 0–35)
AST: 13 U/L (ref 0–37)
BILIRUBIN DIRECT: 0.1 mg/dL (ref 0.0–0.3)
BILIRUBIN TOTAL: 0.3 mg/dL (ref 0.2–1.2)
TOTAL PROTEIN: 7 g/dL (ref 6.0–8.3)

## 2016-07-18 LAB — BASIC METABOLIC PANEL
BUN: 18 mg/dL (ref 6–23)
CALCIUM: 9.7 mg/dL (ref 8.4–10.5)
CO2: 27 mEq/L (ref 19–32)
CREATININE: 0.68 mg/dL (ref 0.40–1.20)
Chloride: 106 mEq/L (ref 96–112)
GFR: 91.28 mL/min (ref >60.00)
Glucose, Bld: 184 mg/dL — ABNORMAL HIGH (ref 70–99)
Potassium: 4.1 mEq/L (ref 3.5–5.1)
Sodium: 139 mEq/L (ref 135–145)

## 2016-07-18 LAB — HEMOGLOBIN A1C: Hgb A1c MFr Bld: 8.7 % — ABNORMAL HIGH (ref 4.6–6.5)

## 2016-07-18 LAB — TSH: TSH: 0.69 u[IU]/mL (ref 0.35–4.50)

## 2016-07-18 LAB — MICROALBUMIN / CREATININE URINE RATIO
Creatinine,U: 145.9 mg/dL
Microalb Creat Ratio: 1.4 mg/g (ref 0.0–30.0)
Microalb, Ur: 2.1 mg/dL — ABNORMAL HIGH (ref 0.0–1.9)

## 2016-07-20 ENCOUNTER — Other Ambulatory Visit: Payer: Self-pay | Admitting: Internal Medicine

## 2016-07-20 ENCOUNTER — Encounter: Payer: Self-pay | Admitting: Internal Medicine

## 2016-07-20 MED ORDER — METFORMIN HCL ER 500 MG PO TB24: ORAL_TABLET | ORAL | 3 refills | Status: DC

## 2016-07-26 ENCOUNTER — Telehealth: Payer: Self-pay | Admitting: Internal Medicine

## 2016-07-26 ENCOUNTER — Ambulatory Visit: Payer: Medicare Other | Admitting: Podiatry

## 2016-07-26 NOTE — Telephone Encounter (Signed)
Routing for Valerie Wade----could you please order cologuard for pt ...thank you

## 2016-07-26 NOTE — Telephone Encounter (Signed)
-----   Message from Biagio Borg, MD sent at 07/21/2016  7:12 PM EST ----- Ok for [place] to order [facility], thanks

## 2016-07-29 ENCOUNTER — Telehealth: Payer: Self-pay | Admitting: *Deleted

## 2016-07-29 MED ORDER — METFORMIN HCL ER 500 MG PO TB24: ORAL_TABLET | ORAL | 3 refills | Status: DC

## 2016-07-29 NOTE — Telephone Encounter (Signed)
Rec'd call pt states MD increase her Metformin to two pills twice a day needing new rx sent to [facility]. Per chart MD sent rx to [facility] will resend to [facility]...Valerie Wade

## 2016-08-03 ENCOUNTER — Telehealth: Payer: Self-pay

## 2016-08-03 NOTE — Telephone Encounter (Signed)
Cologuard ordered via [employer] [id number]

## 2016-08-09 ENCOUNTER — Ambulatory Visit (HOSPITAL_COMMUNITY): Admission: RE | Admit: 2016-08-09 | Payer: Medicare Other | Source: Ambulatory Visit

## 2016-08-10 ENCOUNTER — Ambulatory Visit
Admission: RE | Admit: 2016-08-10 | Discharge: 2016-08-10 | Disposition: A | Discharge status: Home / Self Care | Payer: Medicare Other | Source: Ambulatory Visit | Attending: Internal Medicine | Admitting: Internal Medicine

## 2016-08-10 DIAGNOSIS — Z1231 Encounter for screening mammogram for malignant neoplasm of breast: Secondary | ICD-10-CM

## 2016-08-13 ENCOUNTER — Other Ambulatory Visit: Payer: Self-pay | Admitting: Internal Medicine

## 2016-08-22 DIAGNOSIS — Z1212 Encounter for screening for malignant neoplasm of rectum: Secondary | ICD-10-CM | POA: Diagnosis not present

## 2016-08-22 DIAGNOSIS — Z1211 Encounter for screening for malignant neoplasm of colon: Secondary | ICD-10-CM | POA: Diagnosis not present

## 2016-08-22 LAB — COLOGUARD

## 2016-08-23 ENCOUNTER — Ambulatory Visit: Payer: Medicare Other | Admitting: Podiatry

## 2016-08-25 LAB — COLOGUARD: COLOGUARD: NEGATIVE

## 2016-09-02 ENCOUNTER — Ambulatory Visit (HOSPITAL_COMMUNITY)
Admission: RE | Admit: 2016-09-02 | Discharge: 2016-09-02 | Disposition: A | Discharge status: Home / Self Care | Payer: Medicare Other | Source: Ambulatory Visit | Attending: Cardiology | Admitting: Cardiology

## 2016-09-02 DIAGNOSIS — I700 Atherosclerosis of aorta: Secondary | ICD-10-CM | POA: Insufficient documentation

## 2016-09-02 DIAGNOSIS — Z87891 Personal history of nicotine dependence: Secondary | ICD-10-CM | POA: Diagnosis not present

## 2016-09-02 DIAGNOSIS — Z8249 Family history of ischemic heart disease and other diseases of the circulatory system: Secondary | ICD-10-CM | POA: Diagnosis not present

## 2016-09-02 DIAGNOSIS — I10 Essential (primary) hypertension: Secondary | ICD-10-CM | POA: Diagnosis not present

## 2016-09-02 DIAGNOSIS — E785 Hyperlipidemia, unspecified: Secondary | ICD-10-CM | POA: Insufficient documentation

## 2016-09-02 DIAGNOSIS — E119 Type 2 diabetes mellitus without complications: Secondary | ICD-10-CM | POA: Insufficient documentation

## 2016-10-11 ENCOUNTER — Telehealth: Payer: Self-pay | Admitting: Internal Medicine

## 2016-10-11 NOTE — Telephone Encounter (Signed)
Checking into patient bill in January of 260.00

## 2016-10-12 ENCOUNTER — Encounter: Payer: Self-pay | Admitting: Gynecology

## 2016-10-18 ENCOUNTER — Encounter: Payer: Self-pay | Admitting: Internal Medicine

## 2016-10-18 ENCOUNTER — Ambulatory Visit (INDEPENDENT_AMBULATORY_CARE_PROVIDER_SITE_OTHER): Payer: Medicare Other | Admitting: Internal Medicine

## 2016-10-18 VITALS — BP 140/90 | HR 67 | Ht 67.0 in | Wt 160.0 lb

## 2016-10-18 DIAGNOSIS — M79644 Pain in right finger(s): Secondary | ICD-10-CM | POA: Diagnosis not present

## 2016-10-18 DIAGNOSIS — E119 Type 2 diabetes mellitus without complications: Secondary | ICD-10-CM

## 2016-10-18 DIAGNOSIS — I10 Essential (primary) hypertension: Secondary | ICD-10-CM

## 2016-10-18 MED ORDER — HYDROCODONE-ACETAMINOPHEN 7.5-325 MG PO TABS: 1.0000 | ORAL_TABLET | Freq: Four times a day (QID) | ORAL | 0 refills | Status: DC | PRN

## 2016-10-18 NOTE — Patient Instructions (Addendum)
OK to stop the gel topical anti-inflammatory  Please take all new medication as prescribed - the pill anti-inflammatory  Please continue all other medications as before, and refills have been done if requested - the hydrocodone  Please have the pharmacy call with any other refills you may need.  Please continue your efforts at being more active, low cholesterol diabetic diet, and weight control.  Please keep your appointments with your specialists as you may have planned  You will be contacted regarding the referral for: Hand surgury

## 2016-10-18 NOTE — Progress Notes (Signed)
Subjective:    Patient ID: Valerie Wade, female    DOB: 1946-02-09, 71 y.o.   MRN: [id number]  HPI    Here to f/u, leaving in a few days to visit family for 1 mo, has ongoing arthritic pain and Pt continues to have recurring LBP without change in severity, bowel or bladder change, fever, wt loss,  worsening LE pain/numbness/weakness, gait change or falls .  Asks for hydrocodone refill as she will want to be more active.  Pt denies chest pain, increased sob or doe, wheezing, orthopnea, PND, increased LE swelling, palpitations, dizziness or syncope.  Pt denies new neurological symptoms such as new headache, or facial or extremity weakness or numbness   Pt denies polydipsia, polyuria   Past Medical History:  Diagnosis Date  . Arthritis    blat thumb cmc  . Depression   . Diabetes mellitus without complication ([place])   . High cholesterol   . Hypertension   . Hyperthyroidism   . Left thyroid nodule 10/01/2015  . Tinnitus of left ear    Past Surgical History:  Procedure Laterality Date  . APPENDECTOMY    . KNEE SURGERY Left    meniscal tear    reports that she has been smoking.  She has never used smokeless tobacco. She reports that she does not drink alcohol or use drugs. family history includes Breast cancer in her sister; Diabetes in her brother, mother, and sister. No Known Allergies Current Outpatient Prescriptions on File Prior to Visit  Medication Sig Dispense Refill  . ACCU-CHEK SOFTCLIX LANCETS lancets 1 each by Other route 2 (two) times daily. Use to help check blood sugars twice a day. Dx E11.9 100 each 3  . aspirin EC 81 MG tablet Take 81 mg by mouth daily.    Marland Kitchen atorvastatin (LIPITOR) 40 MG tablet Take 1 tablet (40 mg total) by mouth daily. 90 tablet 3  . Blood Glucose Monitoring Suppl (ACCU-CHEK AVIVA PLUS) w/Device KIT Use to check blood sugars twice a day. Dx E11.9 1 kit 0  . glucose blood (ACCU-CHEK AVIVA PLUS) test strip 1 each by Other route 2 (two) times daily. Use  to check blood sugars twice a day. Dx E11.9 100 each 3  . Lancets MISC Use as directed once daily 100 each 3  . lisinopril-hydrochlorothiazide (PRINZIDE,ZESTORETIC) 20-25 MG tablet TAKE ONE TABLET BY MOUTH ONCE DAILY 90 tablet 3  . meloxicam (MOBIC) 15 MG tablet TAKE ONE TABLET BY MOUTH ONCE DAILY 90 tablet 0  . metFORMIN (GLUCOPHAGE-XR) 500 MG 24 hr tablet Take 4 tablet by mouth daily in the AM 360 tablet 3  . propranolol (INDERAL) 10 MG tablet TAKE ONE TABLET BY MOUTH TWICE DAILY 180 tablet 1  . tiZANidine (ZANAFLEX) 4 MG tablet Take 1 tablet (4 mg total) by mouth every 6 (six) hours as needed for muscle spasms. 30 tablet 2   No current facility-administered medications on file prior to visit.     Review of Systems  Constitutional: Negative for other unusual diaphoresis or sweats HENT: Negative for ear discharge or swelling Eyes: Negative for other worsening visual disturbances Respiratory: Negative for stridor or other swelling  Gastrointestinal: Negative for worsening distension or other blood Genitourinary: Negative for retention or other urinary change Musculoskeletal: Negative for other MSK pain or swelling Skin: Negative for color change or other new lesions Neurological: Negative for worsening tremors and other numbness  Psychiatric/Behavioral: Negative for worsening agitation or other fatigue All other system neg per pt  Objective:   Physical Exam BP 140/90   Pulse 67   Ht '5\' 7"'$  (1.702 m)   Wt 160 lb (72.6 kg)   SpO2 99%   BMI 25.06 kg/m  VS noted,  Constitutional: Pt appears in NAD HENT: Head: NCAT.  Right Ear: External ear normal.  Left Ear: External ear normal.  Eyes: . Pupils are equal, round, and reactive to light. Conjunctivae and EOM are normal Nose: without d/c or deformity Neck: Neck supple. Gross normal ROM Cardiovascular: Normal rate and regular rhythm.   Pulmonary/Chest: Effort normal and breath sounds without rales or wheezing.  Right thumb CMC with  1-2+ swelling, with bony deformity and reduced ROM Neurological: Pt is alert. At baseline orientation, motor grossly intact Skin: Skin is warm. No rashes, other new lesions, no LE edema Psychiatric: Pt behavior is normal without agitation  No other exam findings    Assessment & Plan:

## 2016-10-23 NOTE — Assessment & Plan Note (Signed)
Unfortunately not better with topical voltaren gel and with some swelling and bony deformity; for mobic prn, and refer hand surgury for further eval and tx per pt request

## 2016-10-23 NOTE — Assessment & Plan Note (Signed)
stable overall by history and exam, recent data reviewed with pt, and pt to continue medical treatment as before,  to f/u any worsening symptoms or concerns BP Readings from Last 3 Encounters:  10/18/16 140/90  07/08/16 124/78  06/28/16 136/78

## 2016-10-23 NOTE — Assessment & Plan Note (Signed)
stable overall by history and exam, and pt to continue medical treatment as before,  to f/u any worsening symptoms or concerns 

## 2016-12-07 ENCOUNTER — Encounter: Payer: Self-pay | Admitting: Podiatry

## 2016-12-07 ENCOUNTER — Ambulatory Visit (INDEPENDENT_AMBULATORY_CARE_PROVIDER_SITE_OTHER): Payer: Medicare Other | Admitting: Podiatry

## 2016-12-07 DIAGNOSIS — M2010 Hallux valgus (acquired), unspecified foot: Secondary | ICD-10-CM | POA: Diagnosis not present

## 2016-12-07 DIAGNOSIS — E119 Type 2 diabetes mellitus without complications: Secondary | ICD-10-CM | POA: Diagnosis not present

## 2016-12-07 NOTE — Progress Notes (Signed)
This patient presents to her office for her yearly diabetic foot exam.  She says that her feet are doing well. She is not having any pain or discomfort through her feet.  She also requests new diabetic shoes for this coming year..  She says she has not worn last year shoes for over 8 months since they were too big and she feels was not measured properly.  She says she fell 4 or 5 times with her shoes and one time. She even broke her to in the fall.  She presents today for her yearly evaluation and to be measured for her new pair of diabetic shoes.  Podiatric Exam: Vascular: dorsalis pedis and posterior tibial pulses are palpable bilateral. Capillary return is immediate. Temperature gradient is WNL. Skin turgor WNL  Sensorium: Normal Semmes Weinstein monofilament test. Normal tactile sensation bilaterally. Nail Exam: Pt has thick disfigured discolored nails with subungual debris noted bilateral entire nail hallux through fifth toenails Ulcer Exam: There is no evidence of ulcer or pre-ulcerative changes or infection. Orthopedic Exam: Muscle tone and strength are WNL. No limitations in general ROM. No crepitus or effusions noted. HAV  B/L Skin: No Porokeratosis. No infection or ulcers  Diagnosis  Diabetic neuropathy  HAV  B/L  Treatment & Plan Procedures and Treatment: Consent by patient was obtained for treatment procedures. Diabetic foot exam performed and all WNL.  Due to the difficulty she had with her previous shoes. She is to make an appointment with rec for him to evaluate and measure and treated her with a new pair of diabetic shoes  Return Visit-Office Procedure: Patient instructed to return to the office for a follow up in one year for yearly diabetic foot exam.    Gardiner Barefoot DPM

## 2016-12-13 ENCOUNTER — Other Ambulatory Visit: Payer: Self-pay | Admitting: Internal Medicine

## 2016-12-13 ENCOUNTER — Other Ambulatory Visit (INDEPENDENT_AMBULATORY_CARE_PROVIDER_SITE_OTHER): Payer: Medicare Other

## 2016-12-13 ENCOUNTER — Encounter: Payer: Self-pay | Admitting: Internal Medicine

## 2016-12-13 ENCOUNTER — Ambulatory Visit (INDEPENDENT_AMBULATORY_CARE_PROVIDER_SITE_OTHER): Payer: Medicare Other | Admitting: Internal Medicine

## 2016-12-13 VITALS — BP 124/72 | HR 73 | Resp 20 | Ht 67.0 in | Wt 161.0 lb

## 2016-12-13 DIAGNOSIS — F419 Anxiety disorder, unspecified: Secondary | ICD-10-CM

## 2016-12-13 DIAGNOSIS — M79644 Pain in right finger(s): Secondary | ICD-10-CM | POA: Diagnosis not present

## 2016-12-13 DIAGNOSIS — F329 Major depressive disorder, single episode, unspecified: Secondary | ICD-10-CM

## 2016-12-13 DIAGNOSIS — I10 Essential (primary) hypertension: Secondary | ICD-10-CM | POA: Diagnosis not present

## 2016-12-13 DIAGNOSIS — R229 Localized swelling, mass and lump, unspecified: Secondary | ICD-10-CM

## 2016-12-13 DIAGNOSIS — E7800 Pure hypercholesterolemia, unspecified: Secondary | ICD-10-CM

## 2016-12-13 DIAGNOSIS — E119 Type 2 diabetes mellitus without complications: Secondary | ICD-10-CM

## 2016-12-13 DIAGNOSIS — G8929 Other chronic pain: Secondary | ICD-10-CM

## 2016-12-13 DIAGNOSIS — Z0000 Encounter for general adult medical examination without abnormal findings: Secondary | ICD-10-CM

## 2016-12-13 DIAGNOSIS — M545 Low back pain: Secondary | ICD-10-CM | POA: Diagnosis not present

## 2016-12-13 LAB — CBC WITH DIFFERENTIAL/PLATELET
Basophils Absolute: 0.1 10*3/uL (ref 0.0–0.1)
Basophils Relative: 0.7 % (ref 0.0–3.0)
EOS PCT: 1.8 % (ref 0.0–5.0)
Eosinophils Absolute: 0.2 10*3/uL (ref 0.0–0.7)
HCT: 41.2 % (ref 36.0–46.0)
Hemoglobin: 14 g/dL (ref 12.0–15.0)
LYMPHS ABS: 3.7 10*3/uL (ref 0.7–4.0)
Lymphocytes Relative: 33.8 % (ref 12.0–46.0)
MCHC: 33.9 g/dL (ref 30.0–36.0)
MCV: 90.3 fl (ref 78.0–100.0)
MONO ABS: 1 10*3/uL (ref 0.1–1.0)
MONOS PCT: 8.8 % (ref 3.0–12.0)
NEUTROS PCT: 54.9 % (ref 43.0–77.0)
Neutro Abs: 6.1 10*3/uL (ref 1.4–7.7)
Platelets: 378 10*3/uL (ref 150.0–400.0)
RBC: 4.56 Mil/uL (ref 3.87–5.11)
RDW: 13.2 % (ref 11.5–15.5)
WBC: 11.1 10*3/uL — ABNORMAL HIGH (ref 4.0–10.5)

## 2016-12-13 LAB — BASIC METABOLIC PANEL
BUN: 23 mg/dL (ref 6–23)
CALCIUM: 10.3 mg/dL (ref 8.4–10.5)
CO2: 27 mEq/L (ref 19–32)
Chloride: 103 mEq/L (ref 96–112)
Creatinine, Ser: 0.66 mg/dL (ref 0.40–1.20)
GFR: 94.36 mL/min (ref >60.00)
Glucose, Bld: 116 mg/dL — ABNORMAL HIGH (ref 70–99)
POTASSIUM: 4.1 meq/L (ref 3.5–5.1)
SODIUM: 138 meq/L (ref 135–145)

## 2016-12-13 LAB — URINALYSIS, ROUTINE W REFLEX MICROSCOPIC
BILIRUBIN URINE: NEGATIVE
HGB URINE DIPSTICK: NEGATIVE
Ketones, ur: NEGATIVE
Nitrite: NEGATIVE
PH: 5.5 (ref 5.0–8.0)
RBC / HPF: NONE SEEN (ref 0–?)
Specific Gravity, Urine: 1.025 (ref 1.000–1.030)
Total Protein, Urine: NEGATIVE
Urine Glucose: NEGATIVE
Urobilinogen, UA: 0.2 (ref 0.0–1.0)

## 2016-12-13 LAB — MICROALBUMIN / CREATININE URINE RATIO
Creatinine,U: 87.8 mg/dL
Microalb Creat Ratio: 0.8 mg/g (ref 0.0–30.0)

## 2016-12-13 LAB — HEPATIC FUNCTION PANEL
ALK PHOS: 69 U/L (ref 39–117)
ALT: 19 U/L (ref 0–35)
AST: 14 U/L (ref 0–37)
Albumin: 4.4 g/dL (ref 3.5–5.2)
BILIRUBIN DIRECT: 0 mg/dL (ref 0.0–0.3)
Total Bilirubin: 0.4 mg/dL (ref 0.2–1.2)
Total Protein: 7.3 g/dL (ref 6.0–8.3)

## 2016-12-13 LAB — LIPID PANEL
CHOL/HDL RATIO: 3
CHOLESTEROL: 151 mg/dL (ref 0–200)
HDL: 44.3 mg/dL (ref >39.00)
LDL CALC: 76 mg/dL (ref 0–99)
NonHDL: 107.11
TRIGLYCERIDES: 155 mg/dL — AB (ref 0.0–149.0)
VLDL: 31 mg/dL (ref 0.0–40.0)

## 2016-12-13 LAB — TSH: TSH: 0.49 u[IU]/mL (ref 0.35–4.50)

## 2016-12-13 LAB — HEMOGLOBIN A1C: Hgb A1c MFr Bld: 7.6 % — ABNORMAL HIGH (ref 4.6–6.5)

## 2016-12-13 MED ORDER — HYDROCODONE-ACETAMINOPHEN 7.5-325 MG PO TABS: 1.0000 | ORAL_TABLET | Freq: Four times a day (QID) | ORAL | 0 refills | Status: DC | PRN

## 2016-12-13 MED ORDER — HYDROXYZINE HCL 25 MG PO TABS: 25.0000 mg | ORAL_TABLET | Freq: Three times a day (TID) | ORAL | 5 refills | Status: DC | PRN

## 2016-12-13 MED ORDER — GLIPIZIDE ER 2.5 MG PO TB24: 2.5000 mg | ORAL_TABLET | Freq: Every day | ORAL | 3 refills | Status: DC

## 2016-12-13 MED ORDER — MELOXICAM 15 MG PO TABS: 15.0000 mg | ORAL_TABLET | Freq: Every day | ORAL | 3 refills | Status: DC

## 2016-12-13 MED ORDER — TIZANIDINE HCL 4 MG PO TABS: 4.0000 mg | ORAL_TABLET | Freq: Four times a day (QID) | ORAL | 2 refills | Status: DC | PRN

## 2016-12-13 NOTE — Progress Notes (Signed)
Pre visit review using our clinic review tool, if applicable. No additional management support is needed unless otherwise documented below in the visit note. 

## 2016-12-13 NOTE — Patient Instructions (Addendum)
Your EKG was OK today  Please take all new medication as prescribed - the atarax as needed for anxiety  You will be contacted regarding the referral for: General surgury for the skin nodule  OK to restart the Voltaren gel for the right thumb and the knees as needed for pain  Please continue all other medications as before, and refills have been done if requested.  Please have the pharmacy call with any other refills you may need.  Please continue your efforts at being more active, low cholesterol diet, and weight control.  You are otherwise up to date with prevention measures today.  Please keep your appointments with your specialists as you may have planned  Please go to the LAB in the Basement (turn left off the elevator) for the tests to be done today  You will be contacted by phone if any changes need to be made immediately.  Otherwise, you will receive a letter about your results with an explanation, but please check with MyChart first.  Please remember to sign up for MyChart if you have not done so, as this will be important to you in the future with finding out test results, communicating by private email, and scheduling acute appointments online when needed.  Please return in 6 months, or sooner if needed, with Lab testing done 3-5 days before  Please quit smoking  1-800-quitnow: To help quit smoking  Continue doing brain stimulating activities (puzzles, reading, adult coloring books, staying active) to keep memory sharp.   Start to eat heart healthy diet (full of fruits, vegetables, whole grains, lean protein, water--limit salt, fat, and sugar intake) and increase physical activity as tolerated.   Valerie Wade , Thank you for taking time to come for your Medicare Wellness Visit. I appreciate your ongoing commitment to your health goals. Please review the following plan we discussed and let me know if I can assist you in the future.   These are the goals we  discussed: Goals    . Joining [employer], government and go to [employer] dance night to increase my social activity       This is a list of the screening recommended for you and due dates:  Health Maintenance  Topic Date Due  . Eye exam for diabetics  11/21/1957  . Tetanus Vaccine  11/22/1966  . Colon Cancer Screening  11/21/1997  . Pneumonia vaccines (2 of 2 - PPSV23) 12/13/2016*  . Flu Shot  12/28/2016  . Hemoglobin A1C  01/15/2017  . Complete foot exam   06/14/2017  . Mammogram  08/11/2018  . DEXA scan (bone density measurement)  Completed  .  Hepatitis C: One time screening is recommended by Center for Disease Control  (CDC) for  adults born from [birth year] through 1963.   Completed  *Topic was postponed. The date shown is not the original due date.    Stress and Stress Management Stress is a normal reaction to life events. It is what you feel when life demands more than you are used to or more than you can handle. Some stress can be useful. For example, the stress reaction can help you catch the last bus of the day, study for a test, or meet a deadline at work. But stress that occurs too often or for too long can cause problems. It can affect your emotional health and interfere with relationships and normal daily activities. Too much stress can weaken your immune system and increase your risk for physical illness. If  you already have a medical problem, stress can make it worse. What are the causes? All sorts of life events may cause stress. An event that causes stress for one person may not be stressful for another person. Major life events commonly cause stress. These may be positive or negative. Examples include losing your job, moving into a new home, getting married, having a baby, or losing a loved one. Less obvious life events may also cause stress, especially if they occur day after day or in combination. Examples include working long hours, driving in traffic, caring for children, being in debt, or  being in a difficult relationship. What are the signs or symptoms? Stress may cause emotional symptoms including, the following:  Anxiety. This is feeling worried, afraid, on Wade, overwhelmed, or out of control.  Anger. This is feeling irritated or impatient.  Depression. This is feeling sad, down, helpless, or guilty.  Difficulty focusing, remembering, or making decisions.  Stress may cause physical symptoms, including the following:  Aches and pains. These may affect your head, neck, back, stomach, or other areas of your body.  Tight muscles or clenched jaw.  Low energy or trouble sleeping.  Stress may cause unhealthy behaviors, including the following:  Eating to feel better (overeating) or skipping meals.  Sleeping too little, too much, or both.  Working too much or putting off tasks (procrastination).  Smoking, drinking alcohol, or using drugs to feel better.  How is this diagnosed? Stress is diagnosed through an assessment by your health care provider. Your health care provider will ask questions about your symptoms and any stressful life events.Your health care provider will also ask about your medical history and may order blood tests or other tests. Certain medical conditions and medicine can cause physical symptoms similar to stress. Mental illness can cause emotional symptoms and unhealthy behaviors similar to stress. Your health care provider may refer you to a mental health professional for further evaluation. How is this treated? Stress management is the recommended treatment for stress.The goals of stress management are reducing stressful life events and coping with stress in healthy ways. Techniques for reducing stressful life events include the following:  Stress identification. Self-monitor for stress and identify what causes stress for you. These skills may help you to avoid some stressful events.  Time management. Set your priorities, keep a calendar of  events, and learn to say "no." These tools can help you avoid making too many commitments.  Techniques for coping with stress include the following:  Rethinking the problem. Try to think realistically about stressful events rather than ignoring them or overreacting. Try to find the positives in a stressful situation rather than focusing on the negatives.  Exercise. Physical exercise can release both physical and emotional tension. The key is to find a form of exercise you enjoy and do it regularly.  Relaxation techniques. These relax the body and mind. Examples include yoga, meditation, tai chi, biofeedback, deep breathing, progressive muscle relaxation, listening to music, being out in nature, journaling, and other hobbies. Again, the key is to find one or more that you enjoy and can do regularly.  Healthy lifestyle. Eat a balanced diet, get plenty of sleep, and do not smoke. Avoid using alcohol or drugs to relax.  Strong support network. Spend time with family, friends, or other people you enjoy being around.Express your feelings and talk things over with someone you trust.  Counseling or talktherapy with a mental health professional may be helpful if you are having  difficulty managing stress on your own. Medicine is typically not recommended for the treatment of stress.Talk to your health care provider if you think you need medicine for symptoms of stress. Follow these instructions at home:  Keep all follow-up visits as directed by your health care provider.  Take all medicines as directed by your health care provider. Contact a health care provider if:  Your symptoms get worse or you start having new symptoms.  You feel overwhelmed by your problems and can no longer manage them on your own. Get help right away if:  You feel like hurting yourself or someone else. This information is not intended to replace advice given to you by your health care provider. Make sure you discuss any  questions you have with your health care provider. Document Released: 11/09/2000 Document Revised: 10/22/2015 Document Reviewed: 01/08/2013 Elsevier Interactive Patient Education  2017 [employer]. Insomnia Insomnia is a sleep disorder that makes it difficult to fall asleep or to stay asleep. Insomnia can cause tiredness (fatigue), low energy, difficulty concentrating, mood swings, and poor performance at work or school. There are three different ways to classify insomnia:  Difficulty falling asleep.  Difficulty staying asleep.  Waking up too early in the morning.  Any type of insomnia can be long-term (chronic) or short-term (acute). Both are common. Short-term insomnia usually lasts for three months or less. Chronic insomnia occurs at least three times a week for longer than three months. What are the causes? Insomnia may be caused by another condition, situation, or substance, such as:  Anxiety.  Certain medicines.  Gastroesophageal reflux disease (GERD) or other gastrointestinal conditions.  Asthma or other breathing conditions.  Restless legs syndrome, sleep apnea, or other sleep disorders.  Chronic pain.  Menopause. This may include hot flashes.  Stroke.  Abuse of alcohol, tobacco, or illegal drugs.  Depression.  Caffeine.  Neurological disorders, such as Alzheimer disease.  An overactive thyroid (hyperthyroidism).  The cause of insomnia may not be known. What increases the risk? Risk factors for insomnia include:  Gender. Women are more commonly affected than men.  Age. Insomnia is more common as you get older.  Stress. This may involve your professional or personal life.  Income. Insomnia is more common in people with lower income.  Lack of exercise.  Irregular work schedule or night shifts.  Traveling between different time zones.  What are the signs or symptoms? If you have insomnia, trouble falling asleep or trouble staying asleep is the main  symptom. This may lead to other symptoms, such as:  Feeling fatigued.  Feeling nervous about going to sleep.  Not feeling rested in the morning.  Having trouble concentrating.  Feeling irritable, anxious, or depressed.  How is this treated? Treatment for insomnia depends on the cause. If your insomnia is caused by an underlying condition, treatment will focus on addressing the condition. Treatment may also include:  Medicines to help you sleep.  Counseling or therapy.  Lifestyle adjustments.  Follow these instructions at home:  Take medicines only as directed by your health care provider.  Keep regular sleeping and waking hours. Avoid naps.  Keep a sleep diary to help you and your health care provider figure out what could be causing your insomnia. Include: ? When you sleep. ? When you wake up during the night. ? How well you sleep. ? How rested you feel the next day. ? Any side effects of medicines you are taking. ? What you eat and drink.  Make  your bedroom a comfortable place where it is easy to fall asleep: ? Put up shades or special blackout curtains to block light from outside. ? Use a white noise machine to block noise. ? Keep the temperature cool.  Exercise regularly as directed by your health care provider. Avoid exercising right before bedtime.  Use relaxation techniques to manage stress. Ask your health care provider to suggest some techniques that may work well for you. These may include: ? Breathing exercises. ? Routines to release muscle tension. ? Visualizing peaceful scenes.  Cut back on alcohol, caffeinated beverages, and cigarettes, especially close to bedtime. These can disrupt your sleep.  Do not overeat or eat spicy foods right before bedtime. This can lead to digestive discomfort that can make it hard for you to sleep.  Limit screen use before bedtime. This includes: ? Watching TV. ? Using your smartphone, tablet, and computer.  Stick to a  routine. This can help you fall asleep faster. Try to do a quiet activity, brush your teeth, and go to bed at the same time each night.  Get out of bed if you are still awake after 15 minutes of trying to sleep. Keep the lights down, but try reading or doing a quiet activity. When you feel sleepy, go back to bed.  Make sure that you drive carefully. Avoid driving if you feel very sleepy.  Keep all follow-up appointments as directed by your health care provider. This is important. Contact a health care provider if:  You are tired throughout the day or have trouble in your daily routine due to sleepiness.  You continue to have sleep problems or your sleep problems get worse. Get help right away if:  You have serious thoughts about hurting yourself or someone else. This information is not intended to replace advice given to you by your health care provider. Make sure you discuss any questions you have with your health care provider. Document Released: 05/13/2000 Document Revised: 10/16/2015 Document Reviewed: 02/14/2014 Elsevier Interactive Patient Education  [employer].

## 2016-12-13 NOTE — Progress Notes (Signed)
Subjective:    Patient ID: Valerie Wade, female    DOB: 26-May-1946, 71 y.o.   MRN: [id number]  HPI  Here for yearly f/u;  Overall doing ok;  Pt denies Chest pain, worsening SOB, DOE, wheezing, orthopnea, PND, worsening LE edema, palpitations, dizziness or syncope.  Pt denies neurological change such as new headache, facial or extremity weakness.  Pt denies polydipsia, polyuria, or low sugar symptoms. Pt states overall good compliance with treatment and medications, good tolerability, and has been trying to follow appropriate diet.  Pt denies worsening depressive symptoms, suicidal ideation or panic. No fever, night sweats, wt loss, loss of appetite, or other constitutional symptoms.  Pt states good ability with ADL's, has low fall risk, home safety reviewed and adequate, no other significant changes in hearing or vision, and occasionally active with exercise.  Bp at home usually < 140/90 (ave about 118-122/70).  Not checking the cbgs recently.  Has a nsubq nodule to the right arm just below the elbow posteriorly.  Still with chronic back and knee pain, really working on not overusing the narcotic, and oral nsaid helps the knee Past Medical History:  Diagnosis Date  . Arthritis    blat thumb cmc  . Depression   . Diabetes mellitus without complication ([place])   . High cholesterol   . Hypertension   . Hyperthyroidism   . Left thyroid nodule 10/01/2015  . Tinnitus of left ear    Past Surgical History:  Procedure Laterality Date  . APPENDECTOMY    . KNEE SURGERY Left    meniscal tear    reports that she has been smoking.  She has never used smokeless tobacco. She reports that she does not drink alcohol or use drugs. family history includes Breast cancer in her sister; Diabetes in her brother, mother, and sister. No Known Allergies Current Outpatient Prescriptions on File Prior to Visit  Medication Sig Dispense Refill  . ACCU-CHEK SOFTCLIX LANCETS lancets 1 each by Other route 2 (two)  times daily. Use to help check blood sugars twice a day. Dx E11.9 100 each 3  . aspirin EC 81 MG tablet Take 81 mg by mouth daily.    Marland Kitchen atorvastatin (LIPITOR) 40 MG tablet Take 1 tablet (40 mg total) by mouth daily. 90 tablet 3  . Blood Glucose Monitoring Suppl (ACCU-CHEK AVIVA PLUS) w/Device KIT Use to check blood sugars twice a day. Dx E11.9 1 kit 0  . glucose blood (ACCU-CHEK AVIVA PLUS) test strip 1 each by Other route 2 (two) times daily. Use to check blood sugars twice a day. Dx E11.9 100 each 3  . Lancets MISC Use as directed once daily 100 each 3  . lisinopril-hydrochlorothiazide (PRINZIDE,ZESTORETIC) 20-25 MG tablet TAKE ONE TABLET BY MOUTH ONCE DAILY 90 tablet 3  . metFORMIN (GLUCOPHAGE-XR) 500 MG 24 hr tablet Take 4 tablet by mouth daily in the AM 360 tablet 3  . propranolol (INDERAL) 10 MG tablet TAKE ONE TABLET BY MOUTH TWICE DAILY 180 tablet 1   No current facility-administered medications on file prior to visit.     Review of Systems Constitutional: Negative for other unusual diaphoresis, sweats, appetite or weight changes HENT: Negative for other worsening hearing loss, ear pain, facial swelling, mouth sores or neck stiffness.   Eyes: Negative for other worsening pain, redness or other visual disturbance.  Respiratory: Negative for other stridor or swelling Cardiovascular: Negative for other palpitations or other chest pain  Gastrointestinal: Negative for worsening diarrhea or loose stools,  blood in stool, distention or other pain Genitourinary: Negative for hematuria, flank pain or other change in urine volume.  Musculoskeletal: Negative for myalgias or other joint swelling.  Skin: Negative for other color change, or other wound or worsening drainage.  Neurological: Negative for other syncope or numbness. Hematological: Negative for other adenopathy or swelling Psychiatric/Behavioral: Negative for hallucinations, other worsening agitation, SI, self-injury, or new decreased  concentration All other system neg per pt    Objective:   Physical Exam BP 124/72   Pulse 73   Resp 20   Ht 5' 7" (1.702 m)   Wt 161 lb (73 kg)   SpO2 99%   BMI 25.22 kg/m  VS noted,  Constitutional: Pt is oriented to person, place, and time. Appears well-developed and well-nourished, in no significant distress and comfortable Head: Normocephalic and atraumatic  Eyes: Conjunctivae and EOM are normal. Pupils are equal, round, and reactive to light Right Ear: External ear normal without discharge Left Ear: External ear normal without discharge Nose: Nose without discharge or deformity Mouth/Throat: Oropharynx is without other ulcerations and moist  Neck: Normal range of motion. Neck supple. No JVD present. No tracheal deviation present or significant neck LA or mass Cardiovascular: Normal rate, regular rhythm, normal heart sounds and intact distal pulses.   Pulmonary/Chest: WOB normal and breath sounds without rales or wheezing  Abdominal: Soft. Bowel sounds are normal. NT. No HSM  Musculoskeletal: Normal range of motion. Exhibits no edema, does have tenderness to right thumb and wrist first dorsal compartment Lymphadenopathy: Has no other cervical adenopathy.  Neurological: Pt is alert and oriented to person, place, and time. Pt has normal reflexes. No cranial nerve deficit. Motor grossly intact, Gait intact Skin: Skin is warm and dry. No rash noted or new ulcerations, has 10 mm raised firm sub1 nodule to right post arm about 3 cm distal to elbow Psychiatric:  Has nervous mood and affect. Behavior is normal without agitation No other exam findings  Lab Results  Component Value Date   WBC 11.1 (H) 12/13/2016   HGB 14.0 12/13/2016   HCT 41.2 12/13/2016   PLT 378.0 12/13/2016   GLUCOSE 116 (H) 12/13/2016   CHOL 151 12/13/2016   TRIG 155.0 (H) 12/13/2016   HDL 44.30 12/13/2016   LDLDIRECT 126.0 03/23/2015   LDLCALC 76 12/13/2016   ALT 19 12/13/2016   AST 14 12/13/2016   NA  138 12/13/2016   K 4.1 12/13/2016   CL 103 12/13/2016   CREATININE 0.66 12/13/2016   BUN 23 12/13/2016   CO2 27 12/13/2016   TSH 0.49 12/13/2016   HGBA1C 7.6 (H) 12/13/2016   MICROALBUR <0.7 12/13/2016    ECG I have personally interpreted today NSR - 69, no acute ischemic changes    Assessment & Plan:

## 2016-12-13 NOTE — Progress Notes (Signed)
 Subjective:   Valerie Wade is a 71 y.o. female who presents for Medicare Annual (Subsequent) preventive examination.  Review of Systems:  No ROS.  Medicare Wellness Visit. Additional risk factors are reflected in the social history.  Cardiac Risk Factors include: advanced age (>55men, >65 women);diabetes mellitus;hypertension Sleep patterns: has frequent nighttime awakenings, feels rested on waking, gets up 1-2 times nightly to void and sleeps 4-5 hours nightly.  Patient reports insomnia issues, discussed recommended sleep tips and stress reduction tips, education was attached to patient's AVS.  Home Safety/Smoke Alarms: Feels safe in home. Smoke alarms in place.  Living environment; residence and Firearm Safety: apartment, no firearms. Lives alone, no needs for DME, good support system  Seat Belt Safety/Bike Helmet: Wears seat belt.   Counseling:   Eye Exam- Reports she is late making her appointment and states she will make an appointment soon Dental- resources given  Female:   Pap- Last 07/08/16, negative      Mammo-  Last 08/10/16, BI-RADS CATEGORY  1: Negative     Dexa scan-  Last 06/22/16, T-score 0.5  normal     CCS- Cologuard  08/22/16,  negative     Objective:     Vitals: BP 124/72   Pulse 73   Resp 20   Ht 5' 7" (1.702 m)   Wt 161 lb (73 kg)   SpO2 99%   BMI 25.22 kg/m   Body mass index is 25.22 kg/m.   Tobacco History  Smoking Status  . Current Every Day Smoker  Smokeless Tobacco  . Never Used     Ready to quit: Not Answered Counseling given: Not Answered   Past Medical History:  Diagnosis Date  . Arthritis    blat thumb cmc  . Depression   . Diabetes mellitus without complication (HCC)   . High cholesterol   . Hypertension   . Hyperthyroidism   . Left thyroid nodule 10/01/2015  . Tinnitus of left ear    Past Surgical History:  Procedure Laterality Date  . APPENDECTOMY    . KNEE SURGERY Left    meniscal tear   Family History    Problem Relation Age of Onset  . Diabetes Mother   . Diabetes Sister   . Breast cancer Sister   . Diabetes Brother    History  Sexual Activity  . Sexual activity: Not Currently    Outpatient Encounter Prescriptions as of 12/13/2016  Medication Sig  . ACCU-CHEK SOFTCLIX LANCETS lancets 1 each by Other route 2 (two) times daily. Use to help check blood sugars twice a day. Dx E11.9  . aspirin EC 81 MG tablet Take 81 mg by mouth daily.  . atorvastatin (LIPITOR) 40 MG tablet Take 1 tablet (40 mg total) by mouth daily.  . Blood Glucose Monitoring Suppl (ACCU-CHEK AVIVA PLUS) w/Device KIT Use to check blood sugars twice a day. Dx E11.9  . glucose blood (ACCU-CHEK AVIVA PLUS) test strip 1 each by Other route 2 (two) times daily. Use to check blood sugars twice a day. Dx E11.9  . HYDROcodone-acetaminophen (NORCO) 7.5-325 MG tablet Take 1 tablet by mouth every 6 (six) hours as needed for moderate pain.  . Lancets MISC Use as directed once daily  . lisinopril-hydrochlorothiazide (PRINZIDE,ZESTORETIC) 20-25 MG tablet TAKE ONE TABLET BY MOUTH ONCE DAILY  . meloxicam (MOBIC) 15 MG tablet Take 1 tablet (15 mg total) by mouth daily.  . metFORMIN (GLUCOPHAGE-XR) 500 MG 24 hr tablet Take 4 tablet by mouth daily   in the AM  . propranolol (INDERAL) 10 MG tablet TAKE ONE TABLET BY MOUTH TWICE DAILY  . tiZANidine (ZANAFLEX) 4 MG tablet Take 1 tablet (4 mg total) by mouth every 6 (six) hours as needed for muscle spasms.  . [DISCONTINUED] HYDROcodone-acetaminophen (NORCO) 7.5-325 MG tablet Take 1 tablet by mouth every 6 (six) hours as needed for moderate pain.  . [DISCONTINUED] meloxicam (MOBIC) 15 MG tablet TAKE ONE TABLET BY MOUTH ONCE DAILY  . [DISCONTINUED] meloxicam (MOBIC) 15 MG tablet Take 1 tablet (15 mg total) by mouth daily.  . [DISCONTINUED] tiZANidine (ZANAFLEX) 4 MG tablet Take 1 tablet (4 mg total) by mouth every 6 (six) hours as needed for muscle spasms.  . [DISCONTINUED] tiZANidine (ZANAFLEX) 4  MG tablet Take 1 tablet (4 mg total) by mouth every 6 (six) hours as needed for muscle spasms.  . hydrOXYzine (ATARAX/VISTARIL) 25 MG tablet Take 1 tablet (25 mg total) by mouth 3 (three) times daily as needed.   No facility-administered encounter medications on file as of 12/13/2016.     Activities of Daily Living In your present state of health, do you have any difficulty performing the following activities: 12/13/2016  Hearing? N  Vision? N  Difficulty concentrating or making decisions? N  Walking or climbing stairs? N  Dressing or bathing? N  Doing errands, shopping? N  Preparing Food and eating ? N  Using the Toilet? N  In the past six months, have you accidently leaked urine? N  Do you have problems with loss of bowel control? N  Managing your Medications? N  Managing your Finances? N  Housekeeping or managing your Housekeeping? N  Some recent data might be hidden    Patient Care Team: John, James W, MD as PCP - General (Internal Medicine) Fernandez, Juan H, MD as Consulting Physician (Gynecology) Mayer, Gregory, DPM as Consulting Physician (Podiatry)    Assessment:    Physical assessment deferred to PCP.  Exercise Activities and Dietary recommendations Current Exercise Habits: The patient does not participate in regular exercise at present, Exercise limited by: orthopedic condition(s)  Diet (meal preparation, eat out, water intake, caffeinated beverages, dairy products, fruits and vegetables): in general, a "healthy" diet  , well balanced      Reviewed heart healthy and diabetic diet, encouraged patient to increase daily water intake. Diet education was provided via handout.  Goals    . Joining [employer] and go to [employer] dance night to increase my social activity      Fall Risk Fall Risk  12/13/2016 06/14/2016 10/01/2015 03/19/2015 02/17/2015  Falls in the past year? Yes Yes Yes Yes Yes  Number falls in past yr: 1 - 2 or more 2 or more -  Injury with Fall? No - No  (No Data) Yes  Risk for fall due to : - - - - History of fall(s)  Follow up Falls prevention discussed - - - -   Depression Screen PHQ 2/9 Scores 12/13/2016 06/14/2016 10/01/2015 02/17/2015  PHQ - 2 Score 2 1 0 0  PHQ- 9 Score 7 - - -     Cognitive Function       Ad8 score reviewed for issues:  Issues making decisions: no  Less interest in hobbies / activities: no  Repeats questions, stories (family complaining): no  Trouble using ordinary gadgets (microwave, computer, phone):no  Forgets the month or year: no  Mismanaging finances: no  Remembering appts: no  Daily problems with thinking and/or memory: no Ad8 score is=   0    Immunization History  Administered Date(s) Administered  . Influenza,inj,Quad PF,36+ Mos 03/19/2015  . Pneumococcal Conjugate-13 04/02/2015   Screening Tests Health Maintenance  Topic Date Due  . OPHTHALMOLOGY EXAM  11/21/1957  . TETANUS/TDAP  11/22/1966  . COLONOSCOPY  11/21/1997  . PNA vac Low Risk Adult (2 of 2 - PPSV23) 12/13/2016 (Originally 04/01/2016)  . INFLUENZA VACCINE  12/28/2016  . HEMOGLOBIN A1C  01/15/2017  . FOOT EXAM  06/14/2017  . MAMMOGRAM  08/11/2018  . DEXA SCAN  Completed  . Hepatitis C Screening  Completed      Plan:    Continue doing brain stimulating activities (puzzles, reading, adult coloring books, staying active) to keep memory sharp.   Start to eat heart healthy diet (full of fruits, vegetables, whole grains, lean protein, water--limit salt, fat, and sugar intake) and increase physical activity as tolerated.  I have personally reviewed and noted the following in the patient's chart:   . Medical and social history . Use of alcohol, tobacco or illicit drugs  . Current medications and supplements . Functional ability and status . Nutritional status . Physical activity . Advanced directives . List of other physicians . Vitals . Screenings to include cognitive, depression, and falls . Referrals and  appointments  In addition, I have reviewed and discussed with patient certain preventive protocols, quality metrics, and best practice recommendations. A written personalized care plan for preventive services as well as general preventive health recommendations were provided to patient.     Michiel Cowboy, RN  12/13/2016  Medical screening examination/treatment/procedure(s) were performed by non-physician practitioner and as supervising physician I was immediately available for consultation/collaboration. I agree with above. Cathlean Cower, MD

## 2016-12-14 ENCOUNTER — Telehealth: Payer: Self-pay

## 2016-12-14 DIAGNOSIS — R229 Localized swelling, mass and lump, unspecified: Secondary | ICD-10-CM | POA: Insufficient documentation

## 2016-12-14 MED ORDER — GLIPIZIDE ER 2.5 MG PO TB24: 2.5000 mg | ORAL_TABLET | Freq: Every day | ORAL | 3 refills | Status: DC

## 2016-12-14 MED ORDER — TIZANIDINE HCL 4 MG PO TABS: 4.0000 mg | ORAL_TABLET | Freq: Four times a day (QID) | ORAL | 2 refills | Status: DC | PRN

## 2016-12-14 MED ORDER — HYDROXYZINE HCL 25 MG PO TABS: 25.0000 mg | ORAL_TABLET | Freq: Three times a day (TID) | ORAL | 5 refills | Status: DC | PRN

## 2016-12-14 MED ORDER — MELOXICAM 15 MG PO TABS
15.0000 mg | ORAL_TABLET | Freq: Every day | ORAL | 3 refills | Status: DC
Start: 2016-12-14 — End: 2017-08-22

## 2016-12-14 NOTE — Assessment & Plan Note (Signed)
?   Mild first dorsal comparment dequervains; for volt gel prn, also ok to use for left knee

## 2016-12-14 NOTE — Telephone Encounter (Signed)
Pt has been informed and expressed understanding.  

## 2016-12-14 NOTE — Assessment & Plan Note (Addendum)
Right arm posteriorly aobut 3 cm distal from elbow, 10 mm, raised without significant overlying skin change, for gen surgury referral per pt reqeust

## 2016-12-14 NOTE — Telephone Encounter (Signed)
-----   Message from Valerie Borg, MD sent at 12/13/2016  8:32 PM EDT ----- Left message on MyChart, pt to cont same tx except  The test results show that your current treatment is OK, except the A1c is still elevated, though it is improved.  Please add a low dose medication called Glipizide ER 2.5 mg per day, to help further get better blood sugar control.    Valerie Wade to please inform pt, I will do rx

## 2016-12-14 NOTE — Assessment & Plan Note (Signed)
Lab Results  Component Value Date   HGBA1C 7.6 (H) 12/13/2016

## 2016-12-14 NOTE — Assessment & Plan Note (Addendum)
stable overall by history and exam, recent data reviewed with pt, and pt to continue medical treatment as before,  to f/u any worsening symptoms or concerns BP Readings from Last 3 Encounters:  12/13/16 124/72  10/18/16 140/90  07/08/16 124/78   Note:  Total time for pt hx, exam, review of record with pt in the room, determination of diagnoses and plan for further eval and tx is > 40 min, with over 50% spent in coordination and counseling of patient, including the differential dx, further evaluation and tx of HTN, HLD, DM, right arm skin nodule, right thumb pain, chronic back pain, as well as panic and anxiety

## 2016-12-14 NOTE — Assessment & Plan Note (Signed)
With intermittent panic at times, for atarax prn,  to f/u any worsening symptoms or concerns

## 2016-12-14 NOTE — Assessment & Plan Note (Signed)
Stable, for pain med refill,  to f/u any worsening symptoms or concerns  

## 2016-12-14 NOTE — Assessment & Plan Note (Signed)
stable overall by history and exam, recent data reviewed with pt, and pt to continue medical treatment as before,  to f/u any worsening symptoms or concerns Lab Results  Component Value Date   LDLCALC 76 12/13/2016   

## 2016-12-19 ENCOUNTER — Other Ambulatory Visit: Payer: Medicare Other | Admitting: Orthotics

## 2016-12-25 ENCOUNTER — Other Ambulatory Visit: Payer: Self-pay | Admitting: Internal Medicine

## 2016-12-26 ENCOUNTER — Ambulatory Visit: Payer: Medicare Other | Admitting: Orthotics

## 2016-12-26 DIAGNOSIS — M2010 Hallux valgus (acquired), unspecified foot: Secondary | ICD-10-CM

## 2016-12-26 DIAGNOSIS — E119 Type 2 diabetes mellitus without complications: Secondary | ICD-10-CM

## 2016-12-26 DIAGNOSIS — L84 Corns and callosities: Secondary | ICD-10-CM

## 2016-12-26 NOTE — Progress Notes (Signed)
Patient came in today for fitting and eval for diabetic shoes: Patient' doctor here is Prudence Davidson  PCP is Cathlean Cower  Patient presents with Diabetic neuropathy, HAV bilat  Patient was measured with brannock device and cast in foam for custom inserts.  Size 8.5 W Patient chose [employer] [id number]

## 2016-12-28 ENCOUNTER — Other Ambulatory Visit: Payer: Self-pay | Admitting: Internal Medicine

## 2016-12-28 DIAGNOSIS — R229 Localized swelling, mass and lump, unspecified: Secondary | ICD-10-CM

## 2017-01-04 ENCOUNTER — Ambulatory Visit (INDEPENDENT_AMBULATORY_CARE_PROVIDER_SITE_OTHER): Payer: Medicare Other | Admitting: Orthopedic Surgery

## 2017-01-11 ENCOUNTER — Ambulatory Visit (INDEPENDENT_AMBULATORY_CARE_PROVIDER_SITE_OTHER): Payer: Medicare Other | Admitting: Orthopedic Surgery

## 2017-01-11 ENCOUNTER — Ambulatory Visit (INDEPENDENT_AMBULATORY_CARE_PROVIDER_SITE_OTHER): Payer: Medicare Other

## 2017-01-11 ENCOUNTER — Encounter (INDEPENDENT_AMBULATORY_CARE_PROVIDER_SITE_OTHER): Payer: Self-pay | Admitting: Orthopedic Surgery

## 2017-01-11 DIAGNOSIS — M79644 Pain in right finger(s): Secondary | ICD-10-CM | POA: Diagnosis not present

## 2017-01-11 MED ORDER — DICLOFENAC SODIUM 1 % TD GEL: 2.0000 g | Freq: Four times a day (QID) | TRANSDERMAL | 3 refills | Status: AC | PRN

## 2017-01-11 NOTE — Progress Notes (Signed)
Office Visit Note   Patient: Valerie Wade           Date of Birth: Jan 31, 1946           MRN: [id number] Visit Date: 01/11/2017              Requested by: Biagio Borg, MD [place], [place] [id number] PCP: Biagio Borg, MD  Chief Complaint  Patient presents with  . Right Arm - Cyst, Pain      HPI: Patient is a 71 year old woman who complains of a varicose vein not on her forearm which she states is sometimes soft sometimes hard. She also complains of arthritic pain in the carpometacarpal joint right thumb. She states that the varicosities sometimes gets bigger sometimes gets smaller and is occasionally blue.  Assessment & Plan: Visit Diagnoses:  1. Thumb pain, right     Plan: A prescription was written for Voltaren gel for the osteoarthritis of the right thumb carpometacarpal joint. Discussed that we could proceed with an injection but patient does not proceed with injection at this time. She can use a neoprene brace to help with the stability. Discussed with the varicose veins this is benign and would not require any intervention.  Follow-Up Instructions: Return if symptoms worsen or fail to improve.   Ortho Exam  Patient is alert, oriented, no adenopathy, well-dressed, normal affect, normal respiratory effort. Examination patient has a normal gait. She has no ulnar rotation of the fingers. She has good pulses her hands are neurovascularly intact. She is point tender to palpation over the carpometacarpal joint of the right. The first dorsal extensor compartment is nontender to palpation. Proximally over the lateral aspect of the patient does have dilation of a varicose vein this is about 3 mm in diameter soft fluctuant purpling color consistent with a varicose vein.  Imaging: Xr Hand Complete Right  Result Date: 01/11/2017 Three-view radiographs of the right hand shows decreased joint space of the carpometacarpal joint right thumb. No ulnar deviation of  the MCP joints no PIP joint destruction  No images are attached to the encounter.  Labs: Lab Results  Component Value Date   HGBA1C 7.6 (H) 12/13/2016   HGBA1C 8.7 (H) 07/18/2016   HGBA1C 8.2 (H) 10/01/2015    Orders:  Orders Placed This Encounter  Procedures  . XR Hand Complete Right   Meds ordered this encounter  Medications  . diclofenac sodium (VOLTAREN) 1 % GEL    Sig: Apply 2 g topically 4 (four) times daily as needed.    Dispense:  100 g    Refill:  3     Procedures: No procedures performed  Clinical Data: No additional findings.  ROS:  All other systems negative, except as noted in the HPI. Review of Systems  Objective: Vital Signs: There were no vitals taken for this visit.  Specialty Comments:  No specialty comments available.  PMFS History: Patient Active Problem List   Diagnosis Date Noted  . Skin nodule 12/14/2016  . Atherosclerosis of aorta ([place]) 06/28/2016  . Back pain due to injury 06/14/2016  . Tooth pain 06/02/2016  . Pain of right thumb 06/02/2016  . Clavicle pain 11/12/2015  . Left thyroid nodule 10/01/2015  . Chronic low back pain 03/19/2015  . Hypertension   . Diabetes mellitus without complication ([place])   . High cholesterol   . Anxiety and depression   . Hyperthyroidism   . Tinnitus of left ear   .  Arthritis    Past Medical History:  Diagnosis Date  . Arthritis    blat thumb cmc  . Depression   . Diabetes mellitus without complication ([place])   . High cholesterol   . Hypertension   . Hyperthyroidism   . Left thyroid nodule 10/01/2015  . Tinnitus of left ear     Family History  Problem Relation Age of Onset  . Diabetes Mother   . Diabetes Sister   . Breast cancer Sister   . Diabetes Brother     Past Surgical History:  Procedure Laterality Date  . APPENDECTOMY    . KNEE SURGERY Left    meniscal tear   Social History   Occupational History  . Not on file.   Social History Main Topics  . Smoking status: Current  Every Day Smoker  . Smokeless tobacco: Never Used  . Alcohol use No  . Drug use: No  . Sexual activity: Not Currently

## 2017-01-23 ENCOUNTER — Ambulatory Visit: Payer: Medicare Other | Admitting: Orthotics

## 2017-01-25 ENCOUNTER — Encounter: Payer: Self-pay | Admitting: Internal Medicine

## 2017-01-25 ENCOUNTER — Ambulatory Visit (INDEPENDENT_AMBULATORY_CARE_PROVIDER_SITE_OTHER): Payer: Medicare Other | Admitting: Internal Medicine

## 2017-01-25 VITALS — BP 112/78 | HR 72 | Temp 98.3°F | Ht 67.0 in | Wt 162.0 lb

## 2017-01-25 DIAGNOSIS — I10 Essential (primary) hypertension: Secondary | ICD-10-CM | POA: Diagnosis not present

## 2017-01-25 DIAGNOSIS — E119 Type 2 diabetes mellitus without complications: Secondary | ICD-10-CM

## 2017-01-25 DIAGNOSIS — G4700 Insomnia, unspecified: Secondary | ICD-10-CM | POA: Diagnosis not present

## 2017-01-25 DIAGNOSIS — F4322 Adjustment disorder with anxiety: Secondary | ICD-10-CM | POA: Diagnosis not present

## 2017-01-25 MED ORDER — ZOLPIDEM TARTRATE 5 MG PO TABS: 5.0000 mg | ORAL_TABLET | Freq: Every evening | ORAL | 0 refills | Status: DC | PRN

## 2017-01-25 NOTE — Assessment & Plan Note (Signed)
BP Readings from Last 3 Encounters:  01/25/17 112/78  12/13/16 124/72  10/18/16 140/90

## 2017-01-25 NOTE — Assessment & Plan Note (Signed)
[facility] for counseling referral, declines SSRI trial for now

## 2017-01-25 NOTE — Assessment & Plan Note (Signed)
Likely reactive to recent stressor, hopefully a short term issue, for ambien 5 qhs prn  to f/u any worsening symptoms or concerns

## 2017-01-25 NOTE — Patient Instructions (Signed)
Please take all new medication as prescribed - the ambien as needed for sleep  You will be contacted regarding the referral for: Counseling -   Please continue all other medications as before, and refills have been done if requested.  Please have the pharmacy call with any other refills you may need.  Please continue your efforts at being more active, low cholesterol diet, and weight control.  Please keep your appointments with your specialists as you may have planned

## 2017-01-25 NOTE — Assessment & Plan Note (Signed)
Lab Results  Component Value Date   HGBA1C 7.6 (H) 12/13/2016  stable overall by history and exam, recent data reviewed with pt, and pt to continue medical treatment as before,  to f/u any worsening symptoms or concerns
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
## 2017-02-13 ENCOUNTER — Ambulatory Visit (INDEPENDENT_AMBULATORY_CARE_PROVIDER_SITE_OTHER): Payer: Medicare Other | Admitting: Family Medicine

## 2017-02-13 ENCOUNTER — Telehealth: Payer: Self-pay | Admitting: Internal Medicine

## 2017-02-13 VITALS — BP 110/74 | HR 85 | Temp 97.5°F | Ht 67.0 in | Wt 160.6 lb

## 2017-02-13 DIAGNOSIS — M47816 Spondylosis without myelopathy or radiculopathy, lumbar region: Secondary | ICD-10-CM

## 2017-02-13 DIAGNOSIS — M545 Low back pain, unspecified: Secondary | ICD-10-CM

## 2017-02-13 MED ORDER — PREDNISONE 10 MG PO TABS
ORAL_TABLET | ORAL | 0 refills | Status: DC
Start: 2017-02-13 — End: 2017-08-22

## 2017-02-13 NOTE — Progress Notes (Signed)
Valerie Wade - 71 y.o. female MRN [id number]  Date of birth: 1946-02-25  SUBJECTIVE:  Including CC & ROS.  Chief Complaint  Patient presents with  . Back Pain    Patient is here today C/O back pain that is lower left sided. It radiates into her left buttock.  This flare-up started on 9.13.18 and the pain has progressed.  She states that she had to call her son to come help her to the bathroom.  Pain is worse with motion and seems to ease when she sits but when she moves it becomes a 10.    Valerie Wade is a 71 year old female that is presenting with left-sided lower back pain. The pain is localized in her lower back with no sciatic type symptoms. Pain started a few days ago. She denies any injury or trauma. She does have significant degenerative joint disease at the L4-L5 region in her back. The pain is significant where she has trouble getting out of bed. The pain is worse in the morning. The pain can be a 10 out of 10. She has tried medications that she has left over at home that did not seem to help. She has tried tizanidine and mobic but has not taken these in a regular basis. She denies any numbness or weakness. No saddle anesthesia or urinary incontinence.  She was seen by Dr. Jenny Reichmann in January 2018 for a fall and subsequent back pain.   I have independently reviewed her lumbar films from 06/14/16 that show degenerative changes with significant loss of disc height at L4-5. Review of lumbar spine from 07/05/12 shows foraminal disc protrusion without definite effect upon the L2 nerve root at L2-3. A disc bulge at L3-4. A chronic disc degeneration bilateral facet hypertrophy at L4-5. Potential effect either both L5 nerve roots with narrowing of the both lateral recesses. She had a DEXA scan on 06/22/16 which shows a normal bone density.  Review of Systems  Constitutional: Negative for fever.  Musculoskeletal: Positive for back pain. Negative for gait problem.  Skin: Negative for color change.     HISTORY: Past Medical, Surgical, Social, and Family History Reviewed & Updated per EMR.   Pertinent Historical Findings include:  Past Medical History:  Diagnosis Date  . Arthritis    blat thumb cmc  . Depression   . Diabetes mellitus without complication ([place])   . High cholesterol   . Hypertension   . Hyperthyroidism   . Left thyroid nodule 10/01/2015  . Tinnitus of left ear     Past Surgical History:  Procedure Laterality Date  . APPENDECTOMY    . KNEE SURGERY Left    meniscal tear    No Known Allergies  Family History  Problem Relation Age of Onset  . Diabetes Mother   . Diabetes Sister   . Breast cancer Sister   . Diabetes Brother      Social History   Social History  . Marital status: Legally Separated    Spouse name: N/A  . Number of children: N/A  . Years of education: N/A   Occupational History  . Not on file.   Social History Main Topics  . Smoking status: Current Every Day Smoker  . Smokeless tobacco: Never Used  . Alcohol use No  . Drug use: No  . Sexual activity: Not Currently   Other Topics Concern  . Not on file   Social History Narrative  . No narrative on file     PHYSICAL EXAM:  VS: BP 110/74 (BP Location: Left Arm, Patient Position: Sitting, Cuff Size: Normal)   Pulse 85   Temp (!) 97.5 F (36.4 C) (Oral)   Ht 5\' 7"  (1.702 m)   Wt 160 lb 9.6 oz (72.8 kg)   SpO2 96%   BMI 25.15 kg/m  Physical Exam Gen: NAD, alert, cooperative with exam, ENT: normal lips, normal nasal mucosa,  Eye: normal EOM, normal conjunctiva and lids CV:  no edema, +2 pedal pulses   Resp: no accessory muscle use, non-labored,  Skin: no rashes, no areas of induration  Neuro: normal tone, normal sensation to touch Psych:  normal insight, alert and oriented MSK:  Back: Tenderness to palpation in the left paraspinal muscle. No tender to palpation over the lumbar spine, SI joint, greater trochanter, or piriformis normal internal and external rotation  of the hips. Negative straight degrees bilaterally. Normal strength hip flexion to resistance. Normal knee extension and flexion. Normal gait. Neurovascular intact      ASSESSMENT & PLAN:   Acute left-sided low back pain without sciatica This appears to be a muscle spasm. She does have arthritis on her imaging. She does report a history of some suggestions of having hypermobility. She does not demonstrate any of these on exam today. If it is more associated with subluxations of her lower back then would concentrate on strengthening as a focus of her treatment. - Advised to take mobic and tizanidine on a regular basis for 10-14 days. - Has tramadol at home as she continues for breakthrough pain. - If no improvement in 2 weeks can consider a trigger point injection. -  referral to physical therapy. Would focus on flexion exercises with her facet arthritis.

## 2017-02-13 NOTE — Telephone Encounter (Signed)
Notified pt w/MD response. rx sent to [facility]...Johny Chess

## 2017-02-13 NOTE — Telephone Encounter (Signed)
Pt called stating she is in a lot of pain, she cannot stand up or walk because of her back pain. She states tiZANidine (ZANAFLEX) 4 MG tablet  Is not working. She states she cannot come in because she is in so much pain. She would like something else sent in. Please advise and call back

## 2017-02-13 NOTE — Telephone Encounter (Signed)
Ok to try a short course of prednisone - done erx

## 2017-02-13 NOTE — Patient Instructions (Addendum)
Thank you for coming in,    Please take the mobic and tizanidine on a regular basis for 10 days.   Please follow up if you don't have any improvement to consider a trigger point injection.    Please feel free to call with any questions or concerns at any time, at [phone]. --Dr. Raeford Razor

## 2017-02-14 DIAGNOSIS — M545 Low back pain, unspecified: Secondary | ICD-10-CM | POA: Insufficient documentation

## 2017-02-14 NOTE — Assessment & Plan Note (Addendum)
This appears to be a muscle spasm. She does have arthritis on her imaging. She does report a history of some suggestions of having hypermobility. She does not demonstrate any of these on exam today. If it is more associated with subluxations of her lower back then would concentrate on strengthening as a focus of her treatment. - Advised to take mobic and tizanidine on a regular basis for 10-14 days. - Has tramadol at home as she continues for breakthrough pain. - If no improvement in 2 weeks can consider a trigger point injection. -  referral to physical therapy. Would focus on flexion exercises with her facet arthritis.

## 2017-02-17 ENCOUNTER — Telehealth: Payer: Self-pay | Admitting: Podiatry

## 2017-02-17 NOTE — Telephone Encounter (Signed)
Pt called checking on status of diabetic shoes. They were on back order until 9.10.18 but have not shipped.  I contacted russell at [employer] and he is checking on them and will call me and let me know what is going on.  I called pt back and made her aware I contacted [employer] and they are to call back with status update.

## 2017-02-21 ENCOUNTER — Ambulatory Visit: Payer: Self-pay | Admitting: Internal Medicine

## 2017-03-01 ENCOUNTER — Ambulatory Visit: Payer: Medicare Other

## 2017-03-09 ENCOUNTER — Other Ambulatory Visit: Payer: Self-pay | Admitting: Internal Medicine

## 2017-03-23 ENCOUNTER — Ambulatory Visit (INDEPENDENT_AMBULATORY_CARE_PROVIDER_SITE_OTHER): Payer: Medicare Other | Admitting: Orthotics

## 2017-03-23 DIAGNOSIS — E119 Type 2 diabetes mellitus without complications: Secondary | ICD-10-CM | POA: Diagnosis not present

## 2017-03-23 DIAGNOSIS — L84 Corns and callosities: Secondary | ICD-10-CM

## 2017-03-23 DIAGNOSIS — M2010 Hallux valgus (acquired), unspecified foot: Secondary | ICD-10-CM | POA: Diagnosis not present

## 2017-03-23 NOTE — Progress Notes (Signed)

## 2017-04-04 ENCOUNTER — Ambulatory Visit: Payer: Self-pay | Admitting: Psychology

## 2017-05-10 ENCOUNTER — Other Ambulatory Visit: Payer: Self-pay | Admitting: Internal Medicine

## 2017-06-13 ENCOUNTER — Ambulatory Visit: Payer: Self-pay | Admitting: Internal Medicine

## 2017-07-17 ENCOUNTER — Telehealth: Payer: Self-pay | Admitting: Internal Medicine

## 2017-07-17 ENCOUNTER — Ambulatory Visit (INDEPENDENT_AMBULATORY_CARE_PROVIDER_SITE_OTHER): Payer: Medicare Other | Admitting: Internal Medicine

## 2017-07-17 ENCOUNTER — Encounter: Payer: Self-pay | Admitting: Internal Medicine

## 2017-07-17 VITALS — BP 122/78 | HR 73 | Temp 97.8°F | Ht 67.0 in | Wt 160.0 lb

## 2017-07-17 DIAGNOSIS — F419 Anxiety disorder, unspecified: Secondary | ICD-10-CM | POA: Diagnosis not present

## 2017-07-17 DIAGNOSIS — E7800 Pure hypercholesterolemia, unspecified: Secondary | ICD-10-CM | POA: Diagnosis not present

## 2017-07-17 DIAGNOSIS — M545 Low back pain: Secondary | ICD-10-CM

## 2017-07-17 DIAGNOSIS — F329 Major depressive disorder, single episode, unspecified: Secondary | ICD-10-CM | POA: Diagnosis not present

## 2017-07-17 DIAGNOSIS — E119 Type 2 diabetes mellitus without complications: Secondary | ICD-10-CM | POA: Diagnosis not present

## 2017-07-17 DIAGNOSIS — I10 Essential (primary) hypertension: Secondary | ICD-10-CM

## 2017-07-17 DIAGNOSIS — G8929 Other chronic pain: Secondary | ICD-10-CM | POA: Diagnosis not present

## 2017-07-17 MED ORDER — HYDROCODONE-ACETAMINOPHEN 7.5-325 MG PO TABS: 1.0000 | ORAL_TABLET | Freq: Four times a day (QID) | ORAL | 0 refills | Status: DC | PRN

## 2017-07-17 MED ORDER — DULOXETINE HCL 30 MG PO CPEP: 30.0000 mg | ORAL_CAPSULE | Freq: Every day | ORAL | 3 refills | Status: DC

## 2017-07-17 MED ORDER — CLONAZEPAM 0.5 MG PO TABS: 0.5000 mg | ORAL_TABLET | Freq: Two times a day (BID) | ORAL | 1 refills | Status: DC | PRN

## 2017-07-17 NOTE — Telephone Encounter (Signed)
Ok, this is done 

## 2017-07-17 NOTE — Progress Notes (Signed)
Subjective:    Patient ID: Valerie Wade, female    DOB: [birth year], 72 y.o.   MRN: [id number]  HPI  Here to f/u; overall doing ok,  Pt denies chest pain, increasing sob or doe, wheezing, orthopnea, PND, increased LE swelling, palpitations, dizziness or syncope.  Pt denies new neurological symptoms such as new headache, or facial or extremity weakness or numbness.  Pt denies polydipsia, polyuria, or low sugar episode.  Pt states overall good compliance with meds, mostly trying to follow appropriate diet, with wt overall stable,  but little exercise however.  Declines flu shot and all immunizations.  Pt continues to have recurring LBP without change in severity, bowel or bladder change, fever, wt loss,  worsening LE pain/numbness/weakness, gait change or falls.  Has had mild worsening depressive symptoms, but no suicidal ideation, or panic; has ongoing anxiety, also increased recently. [place] for parking application signed and desparate for pain control.  Has seen pain management in the past on 6 per day vicodin per pt, does not want to take regularly as "I have high pain tolerance" and indeed has not required rx since July 2018 verified by [facility].  Admits being given a  Friends klonopin .5 mg with marked nervous improvement   Jan 2018 LS spine films c/w severe degnerative disc dz Past Medical History:  Diagnosis Date  . Arthritis    blat thumb cmc  . Depression   . Diabetes mellitus without complication ([place])   . High cholesterol   . Hypertension   . Hyperthyroidism   . Left thyroid nodule 10/01/2015  . Tinnitus of left ear    Past Surgical History:  Procedure Laterality Date  . APPENDECTOMY    . KNEE SURGERY Left    meniscal tear    reports that she has been smoking.  she has never used smokeless tobacco. She reports that she does not drink alcohol or use drugs. family history includes Breast cancer in her sister; Diabetes in her brother, mother, and sister. No Known  Allergies Current Outpatient Medications on File Prior to Visit  Medication Sig Dispense Refill  . ACCU-CHEK SOFTCLIX LANCETS lancets 1 each by Other route 2 (two) times daily. Use to help check blood sugars twice a day. Dx E11.9 100 each 3  . aspirin EC 81 MG tablet Take 81 mg by mouth daily.    Marland Kitchen atorvastatin (LIPITOR) 40 MG tablet TAKE ONE TABLET BY MOUTH ONCE DAILY 90 tablet 1  . Blood Glucose Monitoring Suppl (ACCU-CHEK AVIVA PLUS) w/Device KIT Use to check blood sugars twice a day. Dx E11.9 1 kit 0  . glipiZIDE (GLUCOTROL XL) 2.5 MG 24 hr tablet Take 1 tablet (2.5 mg total) by mouth daily with breakfast. 90 tablet 3  . glucose blood (ACCU-CHEK AVIVA PLUS) test strip 1 each by Other route 2 (two) times daily. Use to check blood sugars twice a day. Dx E11.9 100 each 3  . Lancets MISC Use as directed once daily 100 each 3  . lisinopril-hydrochlorothiazide (PRINZIDE,ZESTORETIC) 20-25 MG tablet TAKE ONE TABLET BY MOUTH ONCE DAILY 90 tablet 0  . meloxicam (MOBIC) 15 MG tablet Take 1 tablet (15 mg total) by mouth daily. 90 tablet 3  . metFORMIN (GLUCOPHAGE-XR) 500 MG 24 hr tablet Take 4 tablet by mouth daily in the AM 360 tablet 3  . predniSONE (DELTASONE) 10 MG tablet 3 tabs by mouth per day for 3 days,2tabs per day for 3 days,1tab per day for 3 days 18 tablet 0  .  propranolol (INDERAL) 10 MG tablet TAKE 1 TABLET BY MOUTH TWICE DAILY 180 tablet 1  . zolpidem (AMBIEN) 5 MG tablet Take 1 tablet (5 mg total) by mouth at bedtime as needed for sleep. 90 tablet 0   No current facility-administered medications on file prior to visit.    Review of Systems  Constitutional: Negative for other unusual diaphoresis or sweats HENT: Negative for ear discharge or swelling Eyes: Negative for other worsening visual disturbances Respiratory: Negative for stridor or other swelling  Gastrointestinal: Negative for worsening distension or other blood Genitourinary: Negative for retention or other urinary  change Musculoskeletal: Negative for other MSK pain or swelling Skin: Negative for color change or other new lesions Neurological: Negative for worsening tremors and other numbness  Psychiatric/Behavioral: Negative for worsening agitation or other fatigue All other system neg per pt    Objective:   Physical Exam BP 122/78   Pulse 73   Temp 97.8 F (36.6 C) (Oral)   Ht '5\' 7"'$  (1.702 m)   Wt 160 lb (72.6 kg)   SpO2 98%   BMI 25.06 kg/m  VS noted,  Constitutional: Pt appears in NAD HENT: Head: NCAT.  Right Ear: External ear normal.  Left Ear: External ear normal.  Eyes: . Pupils are equal, round, and reactive to light. Conjunctivae and EOM are normal Nose: without d/c or deformity Neck: Neck supple. Gross normal ROM Cardiovascular: Normal rate and regular rhythm.   Pulmonary/Chest: Effort normal and breath sounds without rales or wheezing.  Abd:  Soft, NT, ND, + BS, no organomegaly Spine, diffuse mod midline lumbar tenderness Neurological: Pt is alert. At baseline orientation, motor grossly intact Skin: Skin is warm. No rashes, other new lesions, no LE edema Psychiatric: Pt behavior is normal without agitation, + depressed nervous affect  No other exam findings    Assessment & Plan:

## 2017-07-17 NOTE — Assessment & Plan Note (Signed)
stable overall by history and exam, recent data reviewed with pt, and pt to continue medical treatment as before,  to f/u any worsening symptoms or concerns Lab Results  Component Value Date   HGBA1C 7.6 (H) 12/13/2016

## 2017-07-17 NOTE — Telephone Encounter (Signed)
Spoke with pharmacist at [facility], she stated that the hydrocodone would need to be sent in for a 5 day supply since the patient is past her 60 day window from getting her last script filled per the new CDC opioid policy. They can hold the script that was sent over during todays OV to be filled after the 5 day supply. Please send over a script for a 5 day supply. Thanks!

## 2017-07-17 NOTE — Addendum Note (Signed)
Addended by: Biagio Borg on: 07/17/2017 06:43 PM   Modules accepted: Orders

## 2017-07-17 NOTE — Assessment & Plan Note (Signed)
Ok for cymbalta 30 qd, klonopin prn,  to f/u any worsening symptoms or concerns

## 2017-07-17 NOTE — Telephone Encounter (Signed)
Copied from [place]. Topic: Quick Communication - See Telephone Encounter >> Jul 17, 2017  3:59 PM Robina Ade, Helene Kelp D wrote: CRM for notification. See Telephone encounter for: 07/17/17. Patient called and said that she would like to talk to Dr. Jenny Reichmann nurse about her medication HYDROcodone-acetaminophen ([place]) 7.5-325 MG tablet. The pharmacy has a confusion on how she takes it and how it is written out. Please call patient back, thanks.

## 2017-07-17 NOTE — Assessment & Plan Note (Signed)
stable overall by history and exam, recent data reviewed with pt, and pt to continue medical treatment as before,  to f/u any worsening symptoms or concerns Lab Results  Component Value Date   LDLCALC 76 12/13/2016

## 2017-07-17 NOTE — Patient Instructions (Addendum)
You will be contacted regarding the referral for: ophthalmology  You are given the handicap parking application signed  Please take all new medication as prescribed - the cymbalta 30 mg per day, and the klonopin as needed  Please continue all other medications as before, and refills have been done if requested - the hydrocodone as needed  Please have the pharmacy call with any other refills you may need.  Please continue your efforts at being more active, low cholesterol diet, and weight control.  You are otherwise up to date with prevention measures today.  Please keep your appointments with your specialists as you may have planned  Please go to the LAB in the Basement (turn left off the elevator) for the tests to be done today  You will be contacted by phone if any changes need to be made immediately.  Otherwise, you will receive a letter about your results with an explanation, but please check with MyChart first.  Please remember to sign up for MyChart if you have not done so, as this will be important to you in the future with finding out test results, communicating by private email, and scheduling acute appointments online when needed.  Please return in 6 months, or sooner if needed

## 2017-07-17 NOTE — Assessment & Plan Note (Signed)
stable overall by history and exam, recent data reviewed with pt, and pt to continue medical treatment as before,  to f/u any worsening symptoms or concerns BP Readings from Last 3 Encounters:  07/17/17 122/78  02/13/17 110/74  01/25/17 112/78

## 2017-07-17 NOTE — Assessment & Plan Note (Addendum)
For limited vicodin prn occas use only,  to f/u any worsening symptoms or concerns

## 2017-07-18 ENCOUNTER — Other Ambulatory Visit: Payer: Self-pay | Admitting: Internal Medicine

## 2017-07-18 NOTE — Telephone Encounter (Signed)
Done erx 

## 2017-07-19 NOTE — Telephone Encounter (Signed)
Pt has been re-informed of the discussion we had yesterday of the new opoid policy.

## 2017-07-19 NOTE — Telephone Encounter (Addendum)
Relation to pt: self Call back number: [phone]  Reason for call:  Patient would like to discuss HYDROcodone-acetaminophen (NORCO) 7.5-325 MG tablet medication refill., please advise

## 2017-07-31 ENCOUNTER — Telehealth: Payer: Self-pay

## 2017-07-31 DIAGNOSIS — F419 Anxiety disorder, unspecified: Principal | ICD-10-CM

## 2017-07-31 DIAGNOSIS — F329 Major depressive disorder, single episode, unspecified: Secondary | ICD-10-CM

## 2017-07-31 DIAGNOSIS — F32A Depression, unspecified: Secondary | ICD-10-CM

## 2017-07-31 NOTE — Addendum Note (Signed)
Addended by: Biagio Borg on: 07/31/2017 07:38 PM   Modules accepted: Orders

## 2017-07-31 NOTE — Telephone Encounter (Signed)
Pt was referred to counseling last august, not sure if she went  I can refer to Psychiatry as this would be best for need for med change - done

## 2017-07-31 NOTE — Telephone Encounter (Signed)
Very sorry, but this is not possible as klonopin is a controlled substance, and xanax is more potentially addictive

## 2017-07-31 NOTE — Telephone Encounter (Signed)
Copied from [place] [phone]. Topic: General - Other >> Jul 31, 2017 11:08 AM Carolyn Stare wrote:  Pt call wanted to let Dr Jenny Reichmann know that the following is not helping her  clonazePAM (KLONOPIN) 0.5 MG tablet  Pt would like something else she said he did mention Xanax and she would try it    pt would like a call back [phone]    Pharmacy..  [facility]

## 2017-07-31 NOTE — Telephone Encounter (Signed)
Pt has been informed and expressed understanding. She would like to know what does she do from her? Would she need a referral to a therapist to get medications accurate to her issues she's having?

## 2017-08-05 ENCOUNTER — Other Ambulatory Visit: Payer: Self-pay | Admitting: Internal Medicine

## 2017-08-07 DIAGNOSIS — E119 Type 2 diabetes mellitus without complications: Secondary | ICD-10-CM | POA: Diagnosis not present

## 2017-08-07 DIAGNOSIS — H25813 Combined forms of age-related cataract, bilateral: Secondary | ICD-10-CM | POA: Diagnosis not present

## 2017-08-11 ENCOUNTER — Telehealth: Payer: Self-pay | Admitting: Internal Medicine

## 2017-08-11 NOTE — Telephone Encounter (Signed)
Copied from [place]. Topic: Quick Communication - Rx Refill/Question >> Aug 11, 2017 11:53 AM Margot Ables wrote: Pt states the hydrocodone is ripping up her stomach. She is wanting to know if there is something else that won't hurt her stomach. Pt thinks it has aspirin in it and that hurts her stomach. Requesting call back. Stated depression medicine isn't working either. Said the doses would work for a cat they are so low.

## 2017-08-11 NOTE — Telephone Encounter (Signed)
MD is out of the office until next week. Pt will need to make f/u appt w/any other provider to discuss symptoms and possible med change.Marland KitchenJohny Chess

## 2017-08-11 NOTE — Telephone Encounter (Signed)
appt made

## 2017-08-21 ENCOUNTER — Other Ambulatory Visit (INDEPENDENT_AMBULATORY_CARE_PROVIDER_SITE_OTHER): Payer: Medicare Other

## 2017-08-21 DIAGNOSIS — E119 Type 2 diabetes mellitus without complications: Secondary | ICD-10-CM | POA: Diagnosis not present

## 2017-08-21 LAB — HEMOGLOBIN A1C: HEMOGLOBIN A1C: 7.3 % — AB (ref 4.6–6.5)

## 2017-08-21 LAB — LIPID PANEL
Cholesterol: 124 mg/dL (ref 0–200)
HDL: 39.9 mg/dL (ref >39.00)
LDL CALC: 62 mg/dL (ref 0–99)
NONHDL: 83.81
Total CHOL/HDL Ratio: 3
Triglycerides: 109 mg/dL (ref 0.0–149.0)
VLDL: 21.8 mg/dL (ref 0.0–40.0)

## 2017-08-21 LAB — BASIC METABOLIC PANEL
BUN: 18 mg/dL (ref 6–23)
CHLORIDE: 106 meq/L (ref 96–112)
CO2: 26 mEq/L (ref 19–32)
Calcium: 9.6 mg/dL (ref 8.4–10.5)
Creatinine, Ser: 0.63 mg/dL (ref 0.40–1.20)
GFR: 99.37 mL/min (ref >60.00)
GLUCOSE: 167 mg/dL — AB (ref 70–99)
POTASSIUM: 4.3 meq/L (ref 3.5–5.1)
Sodium: 140 mEq/L (ref 135–145)

## 2017-08-21 LAB — HEPATIC FUNCTION PANEL
ALBUMIN: 3.7 g/dL (ref 3.5–5.2)
ALK PHOS: 64 U/L (ref 39–117)
ALT: 16 U/L (ref 0–35)
AST: 12 U/L (ref 0–37)
BILIRUBIN DIRECT: 0.1 mg/dL (ref 0.0–0.3)
TOTAL PROTEIN: 7 g/dL (ref 6.0–8.3)
Total Bilirubin: 0.6 mg/dL (ref 0.2–1.2)

## 2017-08-22 ENCOUNTER — Ambulatory Visit (INDEPENDENT_AMBULATORY_CARE_PROVIDER_SITE_OTHER): Payer: Medicare Other | Admitting: Internal Medicine

## 2017-08-22 ENCOUNTER — Encounter: Payer: Self-pay | Admitting: Internal Medicine

## 2017-08-22 VITALS — BP 116/80 | HR 72 | Temp 98.4°F | Ht 67.0 in | Wt 164.0 lb

## 2017-08-22 DIAGNOSIS — G8929 Other chronic pain: Secondary | ICD-10-CM

## 2017-08-22 DIAGNOSIS — I10 Essential (primary) hypertension: Secondary | ICD-10-CM | POA: Diagnosis not present

## 2017-08-22 DIAGNOSIS — M545 Low back pain: Secondary | ICD-10-CM

## 2017-08-22 DIAGNOSIS — E119 Type 2 diabetes mellitus without complications: Secondary | ICD-10-CM

## 2017-08-22 DIAGNOSIS — E7800 Pure hypercholesterolemia, unspecified: Secondary | ICD-10-CM | POA: Diagnosis not present

## 2017-08-22 MED ORDER — HYDROCODONE-ACETAMINOPHEN 10-325 MG PO TABS: 1.0000 | ORAL_TABLET | Freq: Two times a day (BID) | ORAL | 0 refills | Status: DC | PRN

## 2017-08-22 NOTE — Progress Notes (Signed)
Subjective:    Patient ID: Valerie Wade, female    DOB: [birth year], 72 y.o.   MRN: [id number]  HPI Here to f/u; overall doing ok,  Pt denies chest pain, increasing sob or doe, wheezing, orthopnea, PND, increased LE swelling, palpitations, dizziness or syncope.  Pt denies new neurological symptoms such as new headache, or facial or extremity weakness or numbness.  Pt denies polydipsia, polyuria, or low sugar episode.  Pt states overall good compliance with meds, mostly trying to follow appropriate diet, with wt overall stable,  but little exercise however.  States can do better with diet, does not want further med, has already cut out red meat but can do better with sugars and carbs.    Wt Readings from Last 3 Encounters:  08/22/17 164 lb (74.4 kg)  07/17/17 160 lb (72.6 kg)  02/13/17 160 lb 9.6 oz (72.8 kg)  Pt continues to have recurring LBP without change in severity, bowel or bladder change, fever, wt loss,  worsening LE pain/numbness/weakness, gait change or falls. No other new complaints or interval change Past Medical History:  Diagnosis Date  . Arthritis    blat thumb cmc  . Depression   . Diabetes mellitus without complication ([place])   . High cholesterol   . Hypertension   . Hyperthyroidism   . Left thyroid nodule 10/01/2015  . Tinnitus of left ear    Past Surgical History:  Procedure Laterality Date  . APPENDECTOMY    . KNEE SURGERY Left    meniscal tear    reports that she has been smoking.  She has never used smokeless tobacco. She reports that she does not drink alcohol or use drugs. family history includes Breast cancer in her sister; Diabetes in her brother, mother, and sister. No Known Allergies Current Outpatient Medications on File Prior to Visit  Medication Sig Dispense Refill  . ACCU-CHEK SOFTCLIX LANCETS lancets 1 each by Other route 2 (two) times daily. Use to help check blood sugars twice a day. Dx E11.9 100 each 3  . aspirin EC 81 MG tablet Take 81 mg by  mouth daily.    Marland Kitchen atorvastatin (LIPITOR) 40 MG tablet TAKE 1 TABLET BY MOUTH ONCE DAILY 90 tablet 1  . Blood Glucose Monitoring Suppl (ACCU-CHEK AVIVA PLUS) w/Device KIT Use to check blood sugars twice a day. Dx E11.9 1 kit 0  . clonazePAM (KLONOPIN) 0.5 MG tablet Take 1 tablet (0.5 mg total) by mouth 2 (two) times daily as needed for anxiety. 60 tablet 1  . glipiZIDE (GLUCOTROL XL) 2.5 MG 24 hr tablet Take 1 tablet (2.5 mg total) by mouth daily with breakfast. 90 tablet 3  . glucose blood (ACCU-CHEK AVIVA PLUS) test strip 1 each by Other route 2 (two) times daily. Use to check blood sugars twice a day. Dx E11.9 100 each 3  . Lancets MISC Use as directed once daily 100 each 3  . lisinopril-hydrochlorothiazide (PRINZIDE,ZESTORETIC) 20-25 MG tablet TAKE ONE TABLET BY MOUTH ONCE DAILY 90 tablet 0  . metFORMIN (GLUCOPHAGE-XR) 500 MG 24 hr tablet Take 4 tablet by mouth daily in the AM 360 tablet 3  . propranolol (INDERAL) 10 MG tablet TAKE 1 TABLET BY MOUTH TWICE DAILY 180 tablet 1  . zolpidem (AMBIEN) 5 MG tablet TAKE 1 TABLET BY MOUTH ONCE DAILY AT BEDTIME AS NEEDED FOR SLEEP 90 tablet 1   No current facility-administered medications on file prior to visit.    Review of Systems  Constitutional: Negative for other  unusual diaphoresis or sweats HENT: Negative for ear discharge or swelling Eyes: Negative for other worsening visual disturbances Respiratory: Negative for stridor or other swelling  Gastrointestinal: Negative for worsening distension or other blood Genitourinary: Negative for retention or other urinary change Musculoskeletal: Negative for other MSK pain or swelling Skin: Negative for color change or other new lesions Neurological: Negative for worsening tremors and other numbness  Psychiatric/Behavioral: Negative for worsening agitation or other fatigue All other system neg per pt    Objective:   Physical Exam BP 116/80   Pulse 72   Temp 98.4 F (36.9 C) (Oral)   Ht _0   (1.702 m)   Wt 164 lb (74.4 kg)   SpO2 95%   BMI 25.69 kg/m  VS noted, not ill appearing Constitutional: Pt appears in NAD HENT: Head: NCAT.  Right Ear: External ear normal.  Left Ear: External ear normal.  Eyes: . Pupils are equal, round, and reactive to light. Conjunctivae and EOM are normal Nose: without d/c or deformity Neck: Neck supple. Gross normal ROM Cardiovascular: Normal rate and regular rhythm.   Pulmonary/Chest: Effort normal and breath sounds without rales or wheezing.  Abd:  Soft, NT, ND, + BS, no organomegaly Spine: nontender low midline Neurological: Pt is alert. At baseline orientation, motor grossly intact Skin: Skin is warm. No rashes, other new lesions, no LE edema Psychiatric: Pt behavior is normal without agitation  No other exam findings  Lab Results  Component Value Date   WBC 11.1 (H) 12/13/2016   HGB 14.0 12/13/2016   HCT 41.2 12/13/2016   PLT 378.0 12/13/2016   GLUCOSE 167 (H) 08/21/2017   CHOL 124 08/21/2017   TRIG 109.0 08/21/2017   HDL 39.90 08/21/2017   LDLDIRECT 126.0 03/23/2015   LDLCALC 62 08/21/2017   ALT 16 08/21/2017   AST 12 08/21/2017   NA 140 08/21/2017   K 4.3 08/21/2017   CL 106 08/21/2017   CREATININE 0.63 08/21/2017   BUN 18 08/21/2017   CO2 26 08/21/2017   TSH 0.49 12/13/2016   HGBA1C 7.3 (H) 08/21/2017   MICROALBUR <0.7 12/13/2016        Assessment & Plan:

## 2017-08-22 NOTE — Patient Instructions (Addendum)
Ok to take pain medication as prescribed  Please continue all other medications as before, and refills have been done if requested.  Please have the pharmacy call with any other refills you may need.  Please continue your efforts at being more active, low cholesterol diabetic diet, and weight control  Please keep your appointments with your specialists as you may have planned  Please return in 6 months, or sooner if needed

## 2017-08-23 NOTE — Assessment & Plan Note (Signed)
Lab Results  Component Value Date   LDLCALC 62 08/21/2017  stable overall by history and exam, recent data reviewed with pt, and pt to continue medical treatment as before,  to f/u any worsening symptoms or concerns

## 2017-08-23 NOTE — Assessment & Plan Note (Signed)
BP Readings from Last 3 Encounters:  08/22/17 116/80  07/17/17 122/78  02/13/17 110/74  stable overall by history and exam, recent data reviewed with pt, and pt to continue medical treatment as before,  to f/u any worsening symptoms or concerns

## 2017-08-23 NOTE — Assessment & Plan Note (Signed)
stable overall by history and exam, recent data reviewed with pt, and pt to continue medical treatment as before,  to f/u any worsening symptoms or concerns, for med refill as above

## 2017-08-23 NOTE — Assessment & Plan Note (Signed)
Lab Results  Component Value Date   HGBA1C 7.3 (H) 08/21/2017   stable overall by history and exam, recent data reviewed with pt, and pt to continue medical treatment as before,  to f/u any worsening symptoms or concerns

## 2017-09-05 ENCOUNTER — Other Ambulatory Visit: Payer: Self-pay | Admitting: Internal Medicine

## 2017-09-06 ENCOUNTER — Ambulatory Visit (INDEPENDENT_AMBULATORY_CARE_PROVIDER_SITE_OTHER): Payer: Medicare Other | Admitting: Internal Medicine

## 2017-09-06 ENCOUNTER — Encounter: Payer: Self-pay | Admitting: Internal Medicine

## 2017-09-06 VITALS — BP 116/78 | HR 69 | Temp 98.0°F | Ht 67.0 in | Wt 165.0 lb

## 2017-09-06 DIAGNOSIS — M545 Low back pain: Secondary | ICD-10-CM

## 2017-09-06 DIAGNOSIS — G8929 Other chronic pain: Secondary | ICD-10-CM

## 2017-09-06 DIAGNOSIS — I10 Essential (primary) hypertension: Secondary | ICD-10-CM

## 2017-09-06 NOTE — Progress Notes (Signed)
Subjective:    Patient ID: Valerie Wade, female    DOB: [birth year], 72 y.o.   MRN: [id number]  HPI  Here for f/u chronic pain, thinks the tylenol aspect of vicodin is possibly hurting her stomach.  Chronic pain since 64, wondering about HGH treatments that she has read about online.  Pt denies chest pain, increased sob or doe, wheezing, orthopnea, PND, increased LE swelling, palpitations, dizziness or syncope.   Pt denies polydipsia, polyuria,  Past Medical History:  Diagnosis Date  . Arthritis    blat thumb cmc  . Depression   . Diabetes mellitus without complication ([place])   . High cholesterol   . Hypertension   . Hyperthyroidism   . Left thyroid nodule 10/01/2015  . Tinnitus of left ear    Past Surgical History:  Procedure Laterality Date  . APPENDECTOMY    . KNEE SURGERY Left    meniscal tear    reports that she has been smoking.  She has never used smokeless tobacco. She reports that she does not drink alcohol or use drugs. family history includes Breast cancer in her sister; Diabetes in her brother, mother, and sister. No Known Allergies Current Outpatient Medications on File Prior to Visit  Medication Sig Dispense Refill  . ACCU-CHEK SOFTCLIX LANCETS lancets 1 each by Other route 2 (two) times daily. Use to help check blood sugars twice a day. Dx E11.9 100 each 3  . aspirin EC 81 MG tablet Take 81 mg by mouth daily.    Marland Kitchen atorvastatin (LIPITOR) 40 MG tablet TAKE 1 TABLET BY MOUTH ONCE DAILY 90 tablet 1  . Blood Glucose Monitoring Suppl (ACCU-CHEK AVIVA PLUS) w/Device KIT Use to check blood sugars twice a day. Dx E11.9 1 kit 0  . clonazePAM (KLONOPIN) 0.5 MG tablet Take 1 tablet (0.5 mg total) by mouth 2 (two) times daily as needed for anxiety. 60 tablet 1  . glipiZIDE (GLUCOTROL XL) 2.5 MG 24 hr tablet Take 1 tablet (2.5 mg total) by mouth daily with breakfast. 90 tablet 3  . glucose blood (ACCU-CHEK AVIVA PLUS) test strip 1 each by Other route 2 (two) times daily. Use to  check blood sugars twice a day. Dx E11.9 100 each 3  . HYDROcodone-acetaminophen (NORCO) 10-325 MG tablet Take 1 tablet by mouth 2 (two) times daily as needed for severe pain. 60 tablet 0  . Lancets MISC Use as directed once daily 100 each 3  . lisinopril-hydrochlorothiazide (PRINZIDE,ZESTORETIC) 20-25 MG tablet TAKE 1 TABLET BY MOUTH ONCE DAILY 90 tablet 1  . metFORMIN (GLUCOPHAGE-XR) 500 MG 24 hr tablet TAKE 4 TABLETS BY MOUTH IN THE MORNING 360 tablet 1  . propranolol (INDERAL) 10 MG tablet TAKE 1 TABLET BY MOUTH TWICE DAILY 180 tablet 1  . zolpidem (AMBIEN) 5 MG tablet TAKE 1 TABLET BY MOUTH ONCE DAILY AT BEDTIME AS NEEDED FOR SLEEP 90 tablet 1   No current facility-administered medications on file prior to visit.    Review of Systems  Constitutional: Negative for other unusual diaphoresis or sweats HENT: Negative for ear discharge or swelling Eyes: Negative for other worsening visual disturbances Respiratory: Negative for stridor or other swelling  Gastrointestinal: Negative for worsening distension or other blood Genitourinary: Negative for retention or other urinary change Musculoskeletal: Negative for other MSK pain or swelling Skin: Negative for color change or other new lesions Neurological: Negative for worsening tremors and other numbness  Psychiatric/Behavioral: Negative for worsening agitation or other fatigue All other system neg per  pt    Objective:   Physical Exam BP 116/78   Pulse 69   Temp 98 F (36.7 C) (Oral)   Ht 5' 7" (1.702 m)   Wt 165 lb (74.8 kg)   SpO2 98%   BMI 25.84 kg/m  VS noted,  Constitutional: Pt appears in NAD HENT: Head: NCAT.  Right Ear: External ear normal.  Left Ear: External ear normal.  Eyes: . Pupils are equal, round, and reactive to light. Conjunctivae and EOM are normal Nose: without d/c or deformity Neck: Neck supple. Gross normal ROM Cardiovascular: Normal rate and regular rhythm.   Pulmonary/Chest: Effort normal and breath  sounds without rales or wheezing.  Neurological: Pt is alert. At baseline orientation, motor grossly intact Skin: Skin is warm. No rashes, other new lesions, no LE edema Psychiatric: Pt behavior is normal without agitation  No other exam findings    Assessment & Plan:

## 2017-09-06 NOTE — Patient Instructions (Signed)
Please continue all other medications as before, and refills have been done if requested.  Please have the pharmacy call with any other refills you may need.  Please continue your efforts at being more active, low cholesterol diet, and weight control.  You are otherwise up to date with prevention measures today.  Please keep your appointments with your specialists as you may have planned  You will be contacted regarding the referral for: pain management

## 2017-09-09 NOTE — Assessment & Plan Note (Signed)
stable overall by history and exam, recent data reviewed with pt, and pt to continue medical treatment as before,  to f/u any worsening symptoms or concerns BP Readings from Last 3 Encounters:  09/06/17 116/78  08/22/17 116/80  07/17/17 122/78

## 2017-09-09 NOTE — Assessment & Plan Note (Signed)
With persistent pain, for pain management referral, cont same tx

## 2017-09-11 ENCOUNTER — Other Ambulatory Visit: Payer: Self-pay | Admitting: Internal Medicine

## 2017-09-12 ENCOUNTER — Telehealth: Payer: Self-pay | Admitting: Internal Medicine

## 2017-09-12 NOTE — Telephone Encounter (Signed)
Copied from [place] [id number]. Topic: Quick Communication - Rx Refill/Question >> Sep 12, 2017  6:17 PM Oliver Pila B wrote: Medication: hgh Pt states she has been approved for a hgh, pt is wanting this prescribed for a Rx and needs it authorized to use, call pt to advise

## 2017-09-12 NOTE — Telephone Encounter (Signed)
Pt states she wants 100 iu's a month, call pt to advise

## 2017-09-13 NOTE — Telephone Encounter (Signed)
See attached note for an Rx for hgh  Pt of Dr. Jenny Reichmann

## 2017-09-13 NOTE — Telephone Encounter (Signed)
Very sorry, but this is not in my scope of practice, as we discussed at her last visit  I could refer to Endocrinology to consider this but pt needs to understand there is no "guarantee" this would be rx

## 2017-09-13 NOTE — Telephone Encounter (Signed)
HGH=Human growth hormone?

## 2017-09-18 DIAGNOSIS — H25812 Combined forms of age-related cataract, left eye: Secondary | ICD-10-CM | POA: Diagnosis not present

## 2017-09-18 DIAGNOSIS — H25811 Combined forms of age-related cataract, right eye: Secondary | ICD-10-CM | POA: Diagnosis not present

## 2017-09-18 DIAGNOSIS — Z01818 Encounter for other preprocedural examination: Secondary | ICD-10-CM | POA: Diagnosis not present

## 2017-09-19 NOTE — Telephone Encounter (Signed)
Called pt no answer LMOM w/MD response../lmb 

## 2017-09-20 ENCOUNTER — Ambulatory Visit (INDEPENDENT_AMBULATORY_CARE_PROVIDER_SITE_OTHER): Payer: Medicare Other | Admitting: Internal Medicine

## 2017-09-20 ENCOUNTER — Encounter: Payer: Self-pay | Admitting: Internal Medicine

## 2017-09-20 VITALS — BP 116/78 | HR 73 | Temp 97.8°F | Ht 67.0 in | Wt 165.0 lb

## 2017-09-20 DIAGNOSIS — E119 Type 2 diabetes mellitus without complications: Secondary | ICD-10-CM

## 2017-09-20 DIAGNOSIS — R1013 Epigastric pain: Secondary | ICD-10-CM | POA: Diagnosis not present

## 2017-09-20 DIAGNOSIS — I10 Essential (primary) hypertension: Secondary | ICD-10-CM | POA: Diagnosis not present

## 2017-09-20 DIAGNOSIS — M545 Low back pain: Secondary | ICD-10-CM

## 2017-09-20 DIAGNOSIS — G8929 Other chronic pain: Secondary | ICD-10-CM | POA: Diagnosis not present

## 2017-09-20 MED ORDER — PANTOPRAZOLE SODIUM 40 MG PO TBEC: 40.0000 mg | DELAYED_RELEASE_TABLET | Freq: Every day | ORAL | 3 refills | Status: DC

## 2017-09-20 MED ORDER — HYDROCODONE-ACETAMINOPHEN 10-325 MG PO TABS: 1.0000 | ORAL_TABLET | Freq: Two times a day (BID) | ORAL | 0 refills | Status: DC | PRN

## 2017-09-20 NOTE — Patient Instructions (Addendum)
Please take all new medication as prescribed - the protonix  Please continue all other medications as before, and refills have been done if requested.  Please have the pharmacy call with any other refills you may need.  Please continue your efforts at being more active, low cholesterol diet, and weight control.  Please keep your appointments with your specialists as you may have planned  Please call if you change your mind about the referral to [facility] or [facility]

## 2017-09-20 NOTE — Progress Notes (Signed)
Subjective:    Patient ID: Valerie Wade, female    DOB: 23-Oct-1945, 72 y.o.   MRN: [id number]  HPI  Here with son; Pt continues to have recurring LBP without change in severity, bowel or bladder change, fever, wt loss,  worsening LE pain/numbness/weakness, gait change or falls, but is wanting increased narcotic and brings the son to plead her case. Able to function with ADLs, no fall. Pain about 5/10.  Pt denies chest pain, increased sob or doe, wheezing, orthopnea, PND, increased LE swelling, palpitations, dizziness or syncope.  Due for both cataract surgury soon, and teeth extractions. Does have mild worsening reflux without other worsening abd pain, dysphagia, n/v, bowel change or blood.   Pt denies fever, wt loss, night sweats, loss of appetite, or other constitutional symptoms  Denies worsening depressive symptoms, suicidal ideation, or panic; has ongoing anxiety   Pt denies polydipsia, polyuria  Past Medical History:  Diagnosis Date  . Arthritis    blat thumb cmc  . Depression   . Diabetes mellitus without complication ([place])   . High cholesterol   . Hypertension   . Hyperthyroidism   . Left thyroid nodule 10/01/2015  . Tinnitus of left ear    Past Surgical History:  Procedure Laterality Date  . APPENDECTOMY    . KNEE SURGERY Left    meniscal tear    reports that she has been smoking.  She has never used smokeless tobacco. She reports that she does not drink alcohol or use drugs. family history includes Breast cancer in her sister; Diabetes in her brother, mother, and sister. No Known Allergies Current Outpatient Medications on File Prior to Visit  Medication Sig Dispense Refill  . ACCU-CHEK SOFTCLIX LANCETS lancets 1 each by Other route 2 (two) times daily. Use to help check blood sugars twice a day. Dx E11.9 100 each 3  . aspirin EC 81 MG tablet Take 81 mg by mouth daily.    Marland Kitchen atorvastatin (LIPITOR) 40 MG tablet TAKE 1 TABLET BY MOUTH ONCE DAILY 90 tablet 1  . Blood  Glucose Monitoring Suppl (ACCU-CHEK AVIVA PLUS) w/Device KIT Use to check blood sugars twice a day. Dx E11.9 1 kit 0  . clonazePAM (KLONOPIN) 0.5 MG tablet Take 1 tablet (0.5 mg total) by mouth 2 (two) times daily as needed for anxiety. 60 tablet 1  . glipiZIDE (GLUCOTROL XL) 2.5 MG 24 hr tablet Take 1 tablet (2.5 mg total) by mouth daily with breakfast. 90 tablet 3  . glucose blood (ACCU-CHEK AVIVA PLUS) test strip 1 each by Other route 2 (two) times daily. Use to check blood sugars twice a day. Dx E11.9 100 each 3  . Lancets MISC Use as directed once daily 100 each 3  . lisinopril-hydrochlorothiazide (PRINZIDE,ZESTORETIC) 20-25 MG tablet TAKE 1 TABLET BY MOUTH ONCE DAILY 90 tablet 1  . metFORMIN (GLUCOPHAGE-XR) 500 MG 24 hr tablet TAKE 4 TABLETS BY MOUTH IN THE MORNING 360 tablet 1  . propranolol (INDERAL) 10 MG tablet TAKE 1 TABLET BY MOUTH TWICE DAILY 180 tablet 1  . zolpidem (AMBIEN) 5 MG tablet TAKE 1 TABLET BY MOUTH ONCE DAILY AT BEDTIME AS NEEDED FOR SLEEP 90 tablet 1   No current facility-administered medications on file prior to visit.    Review of Systems  Constitutional: Negative for other unusual diaphoresis or sweats HENT: Negative for ear discharge or swelling Eyes: Negative for other worsening visual disturbances Respiratory: Negative for stridor or other swelling  Gastrointestinal: Negative for worsening distension  or other blood Genitourinary: Negative for retention or other urinary change Musculoskeletal: Negative for other MSK pain or swelling Skin: Negative for color change or other new lesions Neurological: Negative for worsening tremors and other numbness  Psychiatric/Behavioral: Negative for worsening agitation or other fatigue All other system neg per pt    Objective:   Physical Exam BP 116/78   Pulse 73   Temp 97.8 F (36.6 C) (Oral)   Ht '5\' 7"'$  (1.702 m)   Wt 165 lb (74.8 kg)   SpO2 96%   BMI 25.84 kg/m  VS noted,  Constitutional: Pt appears in  NAD HENT: Head: NCAT.  Right Ear: External ear normal.  Left Ear: External ear normal.  Eyes: . Pupils are equal, round, and reactive to light. Conjunctivae and EOM are normal Nose: without d/c or deformity Neck: Neck supple. Gross normal ROM Cardiovascular: Normal rate and regular rhythm.   Pulmonary/Chest: Effort normal and breath sounds without rales or wheezing.  Abd:  Soft, NT, ND, + BS, no organomegaly Spine low lumbar diffuse tender without swelling or rash Neurological: Pt is alert. At baseline orientation, motor grossly intact Skin: Skin is warm. No rashes, other new lesions, no LE edema Psychiatric: Pt behavior is normal without agitation , 1-2+ nervous No other exam findings     Assessment & Plan:

## 2017-09-21 DIAGNOSIS — R1013 Epigastric pain: Secondary | ICD-10-CM | POA: Insufficient documentation

## 2017-09-21 NOTE — Assessment & Plan Note (Signed)
Milder pain than back pain, differential includes gerd vs gastritis, to d/c nsaids prn, avoid ETOH, to start PPI, consider GI referral if not improved

## 2017-09-21 NOTE — Assessment & Plan Note (Signed)
Lab Results  Component Value Date   HGBA1C 7.3 (H) 08/21/2017  stable overall by history and exam, recent data reviewed with pt, and pt to continue medical treatment as before,  to f/u any worsening symptoms or concerns

## 2017-09-21 NOTE — Assessment & Plan Note (Signed)
stable overall by history and exam, recent data reviewed with pt, and pt to continue medical treatment as before,  to f/u any worsening symptoms or concerns BP Readings from Last 3 Encounters:  09/20/17 116/78  09/06/17 116/78  08/22/17 116/80

## 2017-09-21 NOTE — Assessment & Plan Note (Signed)
Overall stable, I do not treat for chronic pain in my practice, I declined to change her regimen, and she should hopefully hear about pain management referral soon

## 2017-09-22 ENCOUNTER — Other Ambulatory Visit: Payer: Self-pay | Admitting: Internal Medicine

## 2017-09-25 NOTE — Telephone Encounter (Signed)
08/15/2017 60# 

## 2017-09-25 NOTE — Telephone Encounter (Signed)
Done erx 

## 2017-09-27 DIAGNOSIS — Z79899 Other long term (current) drug therapy: Secondary | ICD-10-CM | POA: Diagnosis not present

## 2017-09-27 DIAGNOSIS — M79651 Pain in right thigh: Secondary | ICD-10-CM | POA: Diagnosis not present

## 2017-09-27 DIAGNOSIS — G894 Chronic pain syndrome: Secondary | ICD-10-CM | POA: Diagnosis not present

## 2017-09-27 DIAGNOSIS — M545 Low back pain: Secondary | ICD-10-CM | POA: Diagnosis not present

## 2017-09-27 DIAGNOSIS — Z79891 Long term (current) use of opiate analgesic: Secondary | ICD-10-CM | POA: Diagnosis not present

## 2017-09-27 DIAGNOSIS — M7918 Myalgia, other site: Secondary | ICD-10-CM | POA: Diagnosis not present

## 2017-09-28 ENCOUNTER — Other Ambulatory Visit: Payer: Self-pay | Admitting: Pain Medicine

## 2017-09-28 DIAGNOSIS — M545 Low back pain, unspecified: Secondary | ICD-10-CM

## 2017-10-02 ENCOUNTER — Ambulatory Visit
Admission: RE | Admit: 2017-10-02 | Discharge: 2017-10-02 | Disposition: A | Discharge status: Home / Self Care | Payer: Medicare Other | Source: Ambulatory Visit | Attending: Pain Medicine | Admitting: Pain Medicine

## 2017-10-02 DIAGNOSIS — M48061 Spinal stenosis, lumbar region without neurogenic claudication: Secondary | ICD-10-CM | POA: Diagnosis not present

## 2017-10-02 DIAGNOSIS — M545 Low back pain, unspecified: Secondary | ICD-10-CM

## 2017-10-03 DIAGNOSIS — H2512 Age-related nuclear cataract, left eye: Secondary | ICD-10-CM | POA: Diagnosis not present

## 2017-10-03 DIAGNOSIS — H25812 Combined forms of age-related cataract, left eye: Secondary | ICD-10-CM | POA: Diagnosis not present

## 2017-10-18 DIAGNOSIS — M545 Low back pain: Secondary | ICD-10-CM | POA: Diagnosis not present

## 2017-10-18 DIAGNOSIS — M79651 Pain in right thigh: Secondary | ICD-10-CM | POA: Diagnosis not present

## 2017-10-18 DIAGNOSIS — M7918 Myalgia, other site: Secondary | ICD-10-CM | POA: Diagnosis not present

## 2017-10-18 DIAGNOSIS — G894 Chronic pain syndrome: Secondary | ICD-10-CM | POA: Diagnosis not present

## 2017-10-19 ENCOUNTER — Other Ambulatory Visit: Payer: Self-pay | Admitting: Internal Medicine

## 2017-10-19 NOTE — Telephone Encounter (Signed)
   PCP: 09/22/2017  60#   Heloise Beecham, DDS: 10/18/2017  12#

## 2017-10-19 NOTE — Telephone Encounter (Signed)
[facility] at [place]

## 2017-10-19 NOTE — Telephone Encounter (Signed)
Copied from [place] [id number]. Topic: Quick Communication - Rx Refill/Question >> Oct 19, 2017  4:15 PM Wynetta Emery, [place] C wrote: Medication: HYDROcodone-acetaminophen (NORCO) 10-325 MG tablet--- pt says that she had 7 molders removed. Pt says that her pain management Dr. Vira Blanco advised that she contact her PCP. Pt says that she is in extreme pain, pt says that pharmacy didn't keep Rx on file, pt says that it will not be filled.   Has the patient contacted their pharmacy? No   (Agent: If no, request that the patient contact the pharmacy for the refill.)  (Agent: If yes, when and what did the pharmacy advise?)  [facility]erred Pharm[facility] (with phone number [facility] street name[facility] [address] Please be advised that RX refills may take up to 3 business days. We ask that you follow-up with your pharmacy.

## 2017-10-19 NOTE — Telephone Encounter (Signed)
Very sorry, no, as this would invalidate her pain contract with Dr Vira Blanco

## 2017-10-20 NOTE — Telephone Encounter (Signed)
Patient has already gotten pain meds from dentist that did procedure, no further action needed

## 2017-11-01 ENCOUNTER — Ambulatory Visit (INDEPENDENT_AMBULATORY_CARE_PROVIDER_SITE_OTHER): Payer: Medicare Other | Admitting: Internal Medicine

## 2017-11-01 VITALS — BP 126/80 | HR 70 | Temp 97.6°F | Ht 67.0 in | Wt 163.0 lb

## 2017-11-01 DIAGNOSIS — G894 Chronic pain syndrome: Secondary | ICD-10-CM | POA: Diagnosis not present

## 2017-11-01 DIAGNOSIS — E119 Type 2 diabetes mellitus without complications: Secondary | ICD-10-CM

## 2017-11-01 DIAGNOSIS — I10 Essential (primary) hypertension: Secondary | ICD-10-CM

## 2017-11-01 DIAGNOSIS — M545 Low back pain: Secondary | ICD-10-CM | POA: Diagnosis not present

## 2017-11-01 DIAGNOSIS — G8929 Other chronic pain: Secondary | ICD-10-CM | POA: Diagnosis not present

## 2017-11-01 MED ORDER — HYDROCODONE-ACETAMINOPHEN 10-325 MG PO TABS: 1.0000 | ORAL_TABLET | Freq: Three times a day (TID) | ORAL | 0 refills | Status: DC | PRN

## 2017-11-01 NOTE — Assessment & Plan Note (Signed)
stable overall by history and exam, recent data reviewed with pt, and pt to continue medical treatment as before,  to f/u any worsening symptoms or concerns BP Readings from Last 3 Encounters:  11/01/17 126/80  09/20/17 116/78  09/06/17 116/78

## 2017-11-01 NOTE — Progress Notes (Signed)
Subjective:    Patient ID: Valerie Wade, female    DOB: 06/03/1945, 72 y.o.   MRN: [id number]  HPI  Here to f/u, states Has seen pain management and declined what sounds like left nerve ablation procedure; states she has not signed a pain contract, and bid norco not working enough. Had a fall 3 days ago with at least mild to mod on chronic now today about 8/10.  Denies urinary symptoms such as dysuria, frequency, urgency, flank pain, hematuria or n/v, fever, chills.  Denies worsening reflux, abd pain, dysphagia, n/v, bowel change or blood. Pt denies chest pain, increased sob or doe, wheezing, orthopnea, PND, increased LE swelling, palpitations, dizziness or syncope.  . Pt denies polydipsia, polyuria   Past Medical History:  Diagnosis Date  . Arthritis    blat thumb cmc  . Depression   . Diabetes mellitus without complication ([place])   . High cholesterol   . Hypertension   . Hyperthyroidism   . Left thyroid nodule 10/01/2015  . Tinnitus of left ear    Past Surgical History:  Procedure Laterality Date  . APPENDECTOMY    . KNEE SURGERY Left    meniscal tear    reports that she has been smoking.  She has never used smokeless tobacco. She reports that she does not drink alcohol or use drugs. family history includes Breast cancer in her sister; Diabetes in her brother, mother, and sister. No Known Allergies Current Outpatient Medications on File Prior to Visit  Medication Sig Dispense Refill  . ACCU-CHEK SOFTCLIX LANCETS lancets 1 each by Other route 2 (two) times daily. Use to help check blood sugars twice a day. Dx E11.9 100 each 3  . aspirin EC 81 MG tablet Take 81 mg by mouth daily.    Marland Kitchen atorvastatin (LIPITOR) 40 MG tablet TAKE 1 TABLET BY MOUTH ONCE DAILY 90 tablet 1  . Blood Glucose Monitoring Suppl (ACCU-CHEK AVIVA PLUS) w/Device KIT Use to check blood sugars twice a day. Dx E11.9 1 kit 0  . clonazePAM (KLONOPIN) 0.5 MG tablet TAKE 1 TABLET BY MOUTH TWICE DAILY AS NEEDED FOR  ANXIETY 60 tablet 1  . glipiZIDE (GLUCOTROL XL) 2.5 MG 24 hr tablet Take 1 tablet (2.5 mg total) by mouth daily with breakfast. 90 tablet 3  . glucose blood (ACCU-CHEK AVIVA PLUS) test strip 1 each by Other route 2 (two) times daily. Use to check blood sugars twice a day. Dx E11.9 100 each 3  . Lancets MISC Use as directed once daily 100 each 3  . lisinopril-hydrochlorothiazide (PRINZIDE,ZESTORETIC) 20-25 MG tablet TAKE 1 TABLET BY MOUTH ONCE DAILY 90 tablet 1  . metFORMIN (GLUCOPHAGE-XR) 500 MG 24 hr tablet TAKE 4 TABLETS BY MOUTH IN THE MORNING 360 tablet 1  . pantoprazole (PROTONIX) 40 MG tablet Take 1 tablet (40 mg total) by mouth daily. 90 tablet 3  . propranolol (INDERAL) 10 MG tablet TAKE 1 TABLET BY MOUTH TWICE DAILY 180 tablet 1  . zolpidem (AMBIEN) 5 MG tablet TAKE 1 TABLET BY MOUTH ONCE DAILY AT BEDTIME AS NEEDED FOR SLEEP 90 tablet 1   No current facility-administered medications on file prior to visit.    Review of Systems  Constitutional: Negative for other unusual diaphoresis or sweats HENT: Negative for ear discharge or swelling Eyes: Negative for other worsening visual disturbances Respiratory: Negative for stridor or other swelling  Gastrointestinal: Negative for worsening distension or other blood Genitourinary: Negative for retention or other urinary change Musculoskeletal: Negative for  other MSK pain or swelling Skin: Negative for color change or other new lesions Neurological: Negative for worsening tremors and other numbness  Psychiatric/Behavioral: Negative for worsening agitation or other fatigue All other system neg per pt    Objective:   Physical Exam BP 126/80   Pulse 70   Temp 97.6 F (36.4 C) (Oral)   Ht '5\' 7"'$  (1.702 m)   Wt 163 lb (73.9 kg)   SpO2 95%   BMI 25.53 kg/m  VS noted,  Constitutional: Pt appears in NAD HENT: Head: NCAT.  Right Ear: External ear normal.  Left Ear: External ear normal.  Eyes: . Pupils are equal, round, and reactive to  light. Conjunctivae and EOM are normal Nose: without d/c or deformity Neck: Neck supple. Gross normal ROM Cardiovascular: Normal rate and regular rhythm.   Pulmonary/Chest: Effort normal and breath sounds without rales or wheezing.  Abd:  Soft, NT, ND, + BS, no organomegaly Spine with diffuse low lumbar and left paravertebral tender Neurological: Pt is alert. At baseline orientation, motor grossly intact Skin: Skin is warm. No rashes, other new lesions, no LE edema Psychiatric: Pt behavior is normal without agitation  No other exam findings  [place] controlled substance database reviewed and printout to chart; no recent narcotic fills    Assessment & Plan:

## 2017-11-01 NOTE — Assessment & Plan Note (Signed)
stable overall by history and exam, recent data reviewed with pt, and pt to continue medical treatment as before,  to f/u any worsening symptoms or concerns Lab Results  Component Value Date   HGBA1C 7.3 (H) 08/21/2017

## 2017-11-01 NOTE — Patient Instructions (Signed)
Please take all new medication as prescribed - the pain medication  You will be contacted regarding the referral for: pain management  Please continue all other medications as before  Please have the pharmacy call with any other refills you may need.  Please keep your appointments with your specialists as you may have planned

## 2017-11-01 NOTE — Assessment & Plan Note (Signed)
Ok for norco tid prn, refer new pain management per pt request, avoid further falls

## 2017-11-02 DIAGNOSIS — H2511 Age-related nuclear cataract, right eye: Secondary | ICD-10-CM | POA: Diagnosis not present

## 2017-11-02 DIAGNOSIS — H25811 Combined forms of age-related cataract, right eye: Secondary | ICD-10-CM | POA: Diagnosis not present

## 2017-11-14 ENCOUNTER — Ambulatory Visit: Payer: Self-pay

## 2017-11-14 NOTE — Telephone Encounter (Signed)
Patient called in with c/o "dizziness." She says "I got up this morning around 1000 and I walked across the floor, then fell up against the wall, the room was spinning. It's not spinning now, but I still feel dizzy, especially when I turn my head." I asked about other symptoms, she denies. According to protocol, see PCP within 24 hours, appointment scheduled for tomorrow at 1120 with Dr. Jenny Reichmann, care advice given, patient verbalized understanding.   Reason for Disposition . [1] MODERATE dizziness (e.g., vertigo; feels very unsteady, interferes with normal activities) AND [2] has NOT been evaluated by physician for this  Answer Assessment - Initial Assessment Questions 1. DESCRIPTION: "Describe your dizziness."     Got up and I feel against the wasll 2. VERTIGO: "Do you feel like either you or the room is spinning or tilting?"      Yes, it was 3. LIGHTHEADED: "Do you feel lightheaded?" (e.g., somewhat faint, woozy, weak upon standing)     Yes 4. SEVERITY: "How bad is it?"  "Can you walk?"   - MILD - Feels unsteady but walking normally.   - MODERATE - Feels very unsteady when walking, but not falling; interferes with normal activities (e.g., school, work) .   - SEVERE - Unable to walk without falling (requires assistance).     Moderate 5. ONSET:  "When did the dizziness begin?"     This morning about 1000 6. AGGRAVATING FACTORS: "Does anything make it worse?" (e.g., standing, change in head position)     Move my head, spins and dizzy 7. CAUSE: "What do you think is causing the dizziness?"     I don't know 8. RECURRENT SYMPTOM: "Have you had dizziness before?" If so, ask: "When was the last time?" "What happened that time?"     Yes, it happens from time to time 9. OTHER SYMPTOMS: "Do you have any other symptoms?" (e.g., headache, weakness, numbness, vomiting, earache)     No 10. PREGNANCY: "Is there any chance you are pregnant?" "When was your last menstrual period?"       No  Protocols  used: DIZZINESS - VERTIGO-A-AH

## 2017-11-15 ENCOUNTER — Ambulatory Visit (INDEPENDENT_AMBULATORY_CARE_PROVIDER_SITE_OTHER): Payer: Medicare Other | Admitting: Internal Medicine

## 2017-11-15 ENCOUNTER — Encounter: Payer: Self-pay | Admitting: Internal Medicine

## 2017-11-15 ENCOUNTER — Other Ambulatory Visit: Payer: Self-pay

## 2017-11-15 VITALS — BP 118/76 | HR 65 | Temp 97.8°F | Ht 67.0 in | Wt 162.0 lb

## 2017-11-15 DIAGNOSIS — J309 Allergic rhinitis, unspecified: Secondary | ICD-10-CM

## 2017-11-15 DIAGNOSIS — I10 Essential (primary) hypertension: Secondary | ICD-10-CM | POA: Diagnosis not present

## 2017-11-15 DIAGNOSIS — E119 Type 2 diabetes mellitus without complications: Secondary | ICD-10-CM

## 2017-11-15 DIAGNOSIS — H6692 Otitis media, unspecified, left ear: Secondary | ICD-10-CM

## 2017-11-15 MED ORDER — METHYLPREDNISOLONE ACETATE 80 MG/ML IJ SUSP
80.0000 mg | Freq: Once | INTRAMUSCULAR | Status: AC
  Administered 2017-11-15: 80 mg via INTRAMUSCULAR

## 2017-11-15 MED ORDER — MECLIZINE HCL 12.5 MG PO TABS: 12.5000 mg | ORAL_TABLET | Freq: Three times a day (TID) | ORAL | 1 refills | Status: AC | PRN

## 2017-11-15 MED ORDER — PROPRANOLOL HCL 10 MG PO TABS: 10.0000 mg | ORAL_TABLET | Freq: Two times a day (BID) | ORAL | 3 refills | Status: DC

## 2017-11-15 MED ORDER — ATORVASTATIN CALCIUM 40 MG PO TABS: 40.0000 mg | ORAL_TABLET | Freq: Every day | ORAL | 3 refills | Status: DC

## 2017-11-15 MED ORDER — BLOOD GLUCOSE MONITOR KIT: PACK | 0 refills | Status: AC

## 2017-11-15 MED ORDER — AZITHROMYCIN 250 MG PO TABS: ORAL_TABLET | ORAL | 1 refills | Status: DC

## 2017-11-15 MED ORDER — GLIPIZIDE ER 2.5 MG PO TB24: 2.5000 mg | ORAL_TABLET | Freq: Every day | ORAL | 3 refills | Status: DC

## 2017-11-15 MED ORDER — BLOOD GLUCOSE MONITOR KIT: PACK | 0 refills | Status: DC

## 2017-11-15 MED ORDER — LISINOPRIL-HYDROCHLOROTHIAZIDE 20-25 MG PO TABS: 1.0000 | ORAL_TABLET | Freq: Every day | ORAL | 3 refills | Status: DC

## 2017-11-15 NOTE — Assessment & Plan Note (Signed)
stable overall by history and exam, recent data reviewed with pt, and pt to continue medical treatment as before,  to f/u any worsening symptoms or concerns BP Readings from Last 3 Encounters:  11/15/17 118/76  11/01/17 126/80  09/20/17 116/78

## 2017-11-15 NOTE — Progress Notes (Signed)
Subjective:    Patient ID: Valerie Wade, female    DOB: 1946-04-01, 72 y.o.   MRN: [id number]  HPI   Here  To f/u after 2 eye surguries (cataracts) and 7 teeth pulled with subsequent pain at the time out of control, has not yet heard about the pain management referral.  Also with positional vertigo like dizziness with turning over, somewhat ataxic with first standing up and will walk in to a wall.  No HA, ear pain or hearing loss, though tinnitus maybe worse on left ear.   Pt denies fever, wt loss, night sweats, loss of appetite, or other constitutional symptoms  No significant sinus or allergy symptoms. Pt denies chest pain, increased sob or doe, wheezing, orthopnea, PND, increased LE swelling, palpitations, dizziness or syncope.  Pt denies new neurological symptoms such as new headache, or facial or extremity weakness or numbness   Pt denies polydipsia, polyuria Wt Readings from Last 3 Encounters:  11/15/17 162 lb (73.5 kg)  11/01/17 163 lb (73.9 kg)  09/20/17 165 lb (74.8 kg)   BP Readings from Last 3 Encounters:  11/15/17 118/76  11/01/17 126/80  09/20/17 116/78   Past Medical History:  Diagnosis Date  . Arthritis    blat thumb cmc  . Depression   . Diabetes mellitus without complication ([place])   . High cholesterol   . Hypertension   . Hyperthyroidism   . Left thyroid nodule 10/01/2015  . Tinnitus of left ear    Past Surgical History:  Procedure Laterality Date  . APPENDECTOMY    . KNEE SURGERY Left    meniscal tear    reports that she has been smoking.  She has never used smokeless tobacco. She reports that she does not drink alcohol or use drugs. family history includes Breast cancer in her sister; Diabetes in her brother, mother, and sister. No Known Allergies Current Outpatient Medications on File Prior to Visit  Medication Sig Dispense Refill  . aspirin EC 81 MG tablet Take 81 mg by mouth daily.    . clonazePAM (KLONOPIN) 0.5 MG tablet TAKE 1 TABLET BY MOUTH TWICE  DAILY AS NEEDED FOR ANXIETY 60 tablet 1  . HYDROcodone-acetaminophen (NORCO) 10-325 MG tablet Take 1 tablet by mouth every 8 (eight) hours as needed for severe pain. To fill November 01, 2017 90 tablet 0  . Lancets MISC Use as directed once daily 100 each 3  . metFORMIN (GLUCOPHAGE-XR) 500 MG 24 hr tablet TAKE 4 TABLETS BY MOUTH IN THE MORNING 360 tablet 1  . zolpidem (AMBIEN) 5 MG tablet TAKE 1 TABLET BY MOUTH ONCE DAILY AT BEDTIME AS NEEDED FOR SLEEP 90 tablet 1   No current facility-administered medications on file prior to visit.    Review of Systems  Constitutional: Negative for other unusual diaphoresis or sweats HENT: Negative for ear discharge or swelling Eyes: Negative for other worsening visual disturbances Respiratory: Negative for stridor or other swelling  Gastrointestinal: Negative for worsening distension or other blood Genitourinary: Negative for retention or other urinary change Musculoskeletal: Negative for other MSK pain or swelling Skin: Negative for color change or other new lesions Neurological: Negative for worsening tremors and other numbness  Psychiatric/Behavioral: Negative for worsening agitation or other fatigue All other system neg per pt    Objective:   Physical Exam BP 118/76   Pulse 65   Temp 97.8 F (36.6 C) (Oral)   Ht 5\' 7"  (1.702 m)   Wt 162 lb (73.5 kg)  SpO2 95%   BMI 25.37 kg/m  VS noted,  Constitutional: Pt appears in NAD HENT: Head: NCAT.  Right Ear: External ear normal.  Left Ear: External ear normal. left TM with mod erythema with effusion Eyes: . Pupils are equal, round, and reactive to light. Conjunctivae and EOM are normal Nose: without d/c or deformity Neck: Neck supple. Gross normal ROM Cardiovascular: Normal rate and regular rhythm.   Pulmonary/Chest: Effort normal and breath sounds without rales or wheezing.  Abd:  Soft, NT, ND, + BS, no organomegaly Neurological: Pt is alert. At baseline orientation, motor grossly intact, cn  2-12 intact Skin: Skin is warm. No rashes, other new lesions, no LE edema Psychiatric: Pt behavior is normal without agitation  No other exam findings  Lab Results  Component Value Date   WBC 11.1 (H) 12/13/2016   HGB 14.0 12/13/2016   HCT 41.2 12/13/2016   PLT 378.0 12/13/2016   GLUCOSE 167 (H) 08/21/2017   CHOL 124 08/21/2017   TRIG 109.0 08/21/2017   HDL 39.90 08/21/2017   LDLDIRECT 126.0 03/23/2015   LDLCALC 62 08/21/2017   ALT 16 08/21/2017   AST 12 08/21/2017   NA 140 08/21/2017   K 4.3 08/21/2017   CL 106 08/21/2017   CREATININE 0.63 08/21/2017   BUN 18 08/21/2017   CO2 26 08/21/2017   TSH 0.49 12/13/2016   HGBA1C 7.3 (H) 08/21/2017   MICROALBUR <0.7 12/13/2016        Assessment & Plan:

## 2017-11-15 NOTE — Telephone Encounter (Signed)
Copied from [place] [id number]. Topic: General - Other >> Nov 15, 2017  3:51 PM Yvette Rack wrote: Reason for CRM: [facility] for diabetic supplies. Geni Bers states Medicare Part B will only pay for pt to test once daily so she needs the Rx to be changed to have pt test once daily.

## 2017-11-15 NOTE — Assessment & Plan Note (Signed)
With mild seasonal flare, for depomedrol IM 80, also allegra and /or nasacort for allergy tx

## 2017-11-15 NOTE — Assessment & Plan Note (Signed)
Mild to mod, for antibx course,  to f/u any worsening symptoms or concerns 

## 2017-11-15 NOTE — Telephone Encounter (Signed)
Done hardcopy to Shirron  

## 2017-11-15 NOTE — Assessment & Plan Note (Signed)
stable overall by history and exam, recent data reviewed with pt, and pt to continue medical treatment as before,  to f/u any worsening symptoms or concerns  

## 2017-11-15 NOTE — Addendum Note (Signed)
Addended by: Juliet Rude on: 11/15/2017 03:35 PM   Modules accepted: Orders

## 2017-11-15 NOTE — Patient Instructions (Addendum)
Please take all new medication as prescribed - the antibiotic, and the meclizine if needed for vertigo  You had the steroid shot today  Ok to try OTC Allegra and/or Nasacort for allergies if needed  Please continue all other medications as before, and refills have been done if requested.  Please have the pharmacy call with any other refills you may need.  Please keep your appointments with your specialists as you may have planned

## 2017-11-24 ENCOUNTER — Other Ambulatory Visit: Payer: Self-pay | Admitting: Internal Medicine

## 2017-11-24 NOTE — Telephone Encounter (Signed)
Copied from [place] [id number]. Topic: Quick Communication - Rx Refill/Question >> Nov 24, 2017 11:40 AM Percell Belt A wrote: Medication: clonazePAM (KLONOPIN) 0.5 MG tablet [[id number]]  HYDROcodone-acetaminophen (NORCO) 10-325 MG tablet [[id number]] - pt is aware this is not due til the 5th   Has the patient contacted their pharmacy? No  (Agent: If no, request that the patient contact the pharmacy for the refill.) (Agent: If yes, when and what did the pharmacy advise?)  Preferred Pharmacy (with phone number or street name[id number] [place] [place]: Please be advised that RX refills may take up to 3 business days. We ask that you follow-up with your pharmacy.

## 2017-11-25 NOTE — Telephone Encounter (Signed)
Clonazepam refill Last Refill:09/25/17 #60 with 1 refill  Hydrocodone refill Last refill: 11/01/17 #90  Last OV: 11/15/17 PCP: Dr. Jenny Reichmann [facility]  [place]

## 2017-11-27 MED ORDER — HYDROCODONE-ACETAMINOPHEN 10-325 MG PO TABS: 1.0000 | ORAL_TABLET | Freq: Three times a day (TID) | ORAL | 0 refills | Status: DC | PRN

## 2017-11-27 MED ORDER — CLONAZEPAM 0.5 MG PO TABS: 0.5000 mg | ORAL_TABLET | Freq: Two times a day (BID) | ORAL | 1 refills | Status: DC | PRN

## 2017-11-27 NOTE — Telephone Encounter (Signed)
Done erx 

## 2017-11-27 NOTE — Telephone Encounter (Signed)
Check [place] registry last filled clonazepam 11/04/2017 & hydrocodone last filled 11/01/2017.Marland KitchenJohny Wade

## 2017-12-11 DIAGNOSIS — H04123 Dry eye syndrome of bilateral lacrimal glands: Secondary | ICD-10-CM | POA: Diagnosis not present

## 2017-12-26 ENCOUNTER — Other Ambulatory Visit: Payer: Self-pay | Admitting: Internal Medicine

## 2017-12-26 NOTE — Telephone Encounter (Signed)
Copied from [place] [id number]. Topic: Quick Communication - See Telephone Encounter >> Dec 26, 2017  9:28 AM Mylinda Latina, NT wrote: CRM for notification. See Telephone encounter for: 12/26/17. Patient called and stats that she needs a refill of her HYDROcodone-acetaminophen ([place]) 10-325 MG tablet  [id number] [address] [place] - [place] [phone] (Phone) [phone] (Fax)

## 2017-12-26 NOTE — Telephone Encounter (Signed)
Will route to office for provider review  Hydrocodone-acetaminophen 10-325refill Last Refill: 11/29/17 Last OV: 11/01/17 PCP:Dr Mendon: [place]

## 2017-12-27 MED ORDER — HYDROCODONE-ACETAMINOPHEN 10-325 MG PO TABS: 1.0000 | ORAL_TABLET | Freq: Three times a day (TID) | ORAL | 0 refills | Status: DC | PRN

## 2017-12-27 NOTE — Addendum Note (Signed)
Addended by: Biagio Borg on: 12/27/2017 12:18 PM   Modules accepted: Orders

## 2017-12-27 NOTE — Telephone Encounter (Signed)
Done erx 

## 2018-01-17 ENCOUNTER — Other Ambulatory Visit: Payer: Self-pay | Admitting: Internal Medicine

## 2018-01-17 MED ORDER — ZOLPIDEM TARTRATE 5 MG PO TABS: ORAL_TABLET | ORAL | 1 refills | Status: AC

## 2018-01-17 NOTE — Telephone Encounter (Signed)
Copied from [place] [phone]. Topic: Quick Communication - Rx Refill/Question >> Jan 17, 2018  2:22 PM Selinda Flavin B, [place] wrote: **Patient is needing new prescription sent to pharmacy. States that she [facility].**  Medication: zolpidem (AMBIEN) 5 MG tablet   Has the patient contacted their pharmacy? Yes.   (Agent: If no, request that the patient contact the pharmacy for the refill.) (Agent: If yes, when and what did the pharmacy advise?)  Preferred Pharmacy (with phone number or street name[id number] [place], [place]: Please be advised that RX refills may take up to 3 business days. We ask that you follow-up with your pharmacy.

## 2018-01-17 NOTE — Telephone Encounter (Signed)
Done erx 

## 2018-01-17 NOTE — Telephone Encounter (Signed)
ambien 5 MG tab refill Last Refill:07/18/17 #90 tabs with 1 refill Last OV: 11/15/17 PCP: Dr. Jenny Reichmann [facility] [place] and routed to PCP

## 2018-01-25 ENCOUNTER — Telehealth: Payer: Self-pay | Admitting: Internal Medicine

## 2018-01-25 MED ORDER — HYDROCODONE-ACETAMINOPHEN 10-325 MG PO TABS: 1.0000 | ORAL_TABLET | Freq: Three times a day (TID) | ORAL | 0 refills | Status: DC | PRN

## 2018-01-25 NOTE — Telephone Encounter (Signed)
Copied from [place] [id number]. Topic: Quick Communication - Rx Refill/Question >> Jan 25, 2018 11:09 AM Judyann Munson wrote: Medication: HYDROcodone-acetaminophen (NORCO) 10-325 MG tablet  Has the patient contacted their pharmacy? No   Preferred Pharmacy (with phone number or street name): [id number] [address], [place] - [place] [phone] (Phone) [phone] (Fax)    Agent: Please be advised that RX refills may take up to 3 business days. We ask that you follow-up with your pharmacy.

## 2018-01-25 NOTE — Telephone Encounter (Signed)
Done erx 

## 2018-01-25 NOTE — Addendum Note (Signed)
Addended by: Biagio Borg on: 01/25/2018 05:16 PM   Modules accepted: Orders

## 2018-02-03 ENCOUNTER — Other Ambulatory Visit: Payer: Self-pay | Admitting: Internal Medicine

## 2018-02-05 NOTE — Telephone Encounter (Signed)
[place] Controlled Substance Database checked. Last filled on 12/27/17  Last OV 11/15/17

## 2018-02-05 NOTE — Telephone Encounter (Signed)
Done erx 

## 2018-02-06 ENCOUNTER — Other Ambulatory Visit (INDEPENDENT_AMBULATORY_CARE_PROVIDER_SITE_OTHER): Payer: Medicare Other

## 2018-02-06 ENCOUNTER — Encounter: Payer: Self-pay | Admitting: Internal Medicine

## 2018-02-06 ENCOUNTER — Other Ambulatory Visit: Payer: Self-pay | Admitting: Internal Medicine

## 2018-02-06 ENCOUNTER — Ambulatory Visit (INDEPENDENT_AMBULATORY_CARE_PROVIDER_SITE_OTHER): Payer: Medicare Other | Admitting: Internal Medicine

## 2018-02-06 VITALS — BP 122/78 | HR 69 | Temp 97.6°F | Ht 67.0 in | Wt 167.0 lb

## 2018-02-06 DIAGNOSIS — G8929 Other chronic pain: Secondary | ICD-10-CM | POA: Diagnosis not present

## 2018-02-06 DIAGNOSIS — F419 Anxiety disorder, unspecified: Secondary | ICD-10-CM | POA: Diagnosis not present

## 2018-02-06 DIAGNOSIS — F329 Major depressive disorder, single episode, unspecified: Secondary | ICD-10-CM | POA: Diagnosis not present

## 2018-02-06 DIAGNOSIS — I10 Essential (primary) hypertension: Secondary | ICD-10-CM

## 2018-02-06 DIAGNOSIS — E119 Type 2 diabetes mellitus without complications: Secondary | ICD-10-CM | POA: Diagnosis not present

## 2018-02-06 DIAGNOSIS — F32A Depression, unspecified: Secondary | ICD-10-CM

## 2018-02-06 DIAGNOSIS — M545 Low back pain: Secondary | ICD-10-CM | POA: Diagnosis not present

## 2018-02-06 DIAGNOSIS — E7800 Pure hypercholesterolemia, unspecified: Secondary | ICD-10-CM | POA: Diagnosis not present

## 2018-02-06 LAB — HEPATIC FUNCTION PANEL
ALK PHOS: 69 U/L (ref 39–117)
ALT: 18 U/L (ref 0–35)
AST: 11 U/L (ref 0–37)
Albumin: 4.1 g/dL (ref 3.5–5.2)
BILIRUBIN DIRECT: 0.1 mg/dL (ref 0.0–0.3)
TOTAL PROTEIN: 7.2 g/dL (ref 6.0–8.3)
Total Bilirubin: 0.7 mg/dL (ref 0.2–1.2)

## 2018-02-06 LAB — LIPID PANEL
CHOL/HDL RATIO: 3
Cholesterol: 139 mg/dL (ref 0–200)
HDL: 41.7 mg/dL (ref >39.00)
LDL CALC: 68 mg/dL (ref 0–99)
NonHDL: 97.53
Triglycerides: 148 mg/dL (ref 0.0–149.0)
VLDL: 29.6 mg/dL (ref 0.0–40.0)

## 2018-02-06 LAB — BASIC METABOLIC PANEL
BUN: 23 mg/dL (ref 6–23)
CALCIUM: 9.8 mg/dL (ref 8.4–10.5)
CHLORIDE: 105 meq/L (ref 96–112)
CO2: 24 meq/L (ref 19–32)
CREATININE: 0.74 mg/dL (ref 0.40–1.20)
GFR: 82.41 mL/min (ref >60.00)
Glucose, Bld: 180 mg/dL — ABNORMAL HIGH (ref 70–99)
Potassium: 4.1 mEq/L (ref 3.5–5.1)
Sodium: 137 mEq/L (ref 135–145)

## 2018-02-06 LAB — HEMOGLOBIN A1C: Hgb A1c MFr Bld: 7.7 % — ABNORMAL HIGH (ref 4.6–6.5)

## 2018-02-06 MED ORDER — GLIPIZIDE ER 5 MG PO TB24: 5.0000 mg | ORAL_TABLET | Freq: Every day | ORAL | 3 refills | Status: AC

## 2018-02-06 MED ORDER — ALPRAZOLAM 0.5 MG PO TABS: 0.5000 mg | ORAL_TABLET | Freq: Three times a day (TID) | ORAL | 1 refills | Status: DC | PRN

## 2018-02-06 NOTE — Assessment & Plan Note (Signed)
stable overall by history and exam, recent data reviewed with pt, and pt to continue medical treatment as before,  to f/u any worsening symptoms or concerns  

## 2018-02-06 NOTE — Progress Notes (Signed)
Subjective:    Patient ID: Valerie Wade, female    DOB: August 18, 1945, 72 y.o.   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  Denies Si or HI, but several panic attacks recent.  Tearful today and states can hardly function with less strength to arms and hands.  Does not want to see hand surgury as she does not want cortisone or surgury.   Has seen Dr Vira Blanco pain management but she has declined recommended nerve ablation for LBP.   Does not want psychiatry or even new pain management.   Pt denies polydipsia, polyuria, or low sugar symptoms such as weakness or confusion improved with po intake.  Pt states overall good compliance with meds, trying to follow lower cholesterol, diabetic diet, wt overall stable but little exercise however.     Pt denies chest pain, increased sob or doe, wheezing, orthopnea, PND, increased LE swelling, palpitations, dizziness or syncope.   Past Medical History:  Diagnosis Date  . Arthritis    blat thumb cmc  . Depression   . Diabetes mellitus without complication ([place])   . High cholesterol   . Hypertension   . Hyperthyroidism   . Left thyroid nodule 10/01/2015  . Tinnitus of left ear    Past Surgical History:  Procedure Laterality Date  . APPENDECTOMY    . KNEE SURGERY Left    meniscal tear    reports that she has been smoking. She has never used smokeless tobacco. She reports that she does not drink alcohol or use drugs. family history includes Breast cancer in her sister; Diabetes in her brother, mother, and sister. No Known Allergies Current Outpatient Medications on File Prior to Visit  Medication Sig Dispense Refill  . aspirin EC 81 MG tablet Take 81 mg by mouth daily.    Marland Kitchen atorvastatin (LIPITOR) 40 MG tablet Take 1 tablet (40 mg total) by mouth daily. 90 tablet  3  . azithromycin (ZITHROMAX Z-PAK) 250 MG tablet 2 tab by mouth day 1, then 1 per day 6 tablet 1  . blood glucose meter kit and supplies KIT Check blood sugar once a day. Accu Check Guide Meter, Lancets and test strips.  E11.9 1 each 0  . glipiZIDE (GLUCOTROL XL) 2.5 MG 24 hr tablet Take 1 tablet (2.5 mg total) by mouth daily with breakfast. 90 tablet 3  . HYDROcodone-acetaminophen (NORCO) 10-325 MG tablet Take 1 tablet by mouth every 8 (eight) hours as needed for severe pain. To fill aug 2,, 2019 90 tablet 0  . Lancets MISC Use as directed once daily 100 each 3  . lisinopril-hydrochlorothiazide (PRINZIDE,ZESTORETIC) 20-25 MG tablet Take 1 tablet by mouth daily. 90 tablet 3  . meclizine (ANTIVERT) 12.5 MG tablet Take 1 tablet (12.5 mg total) by mouth 3 (three) times daily as needed for dizziness. 40 tablet 1  . metFORMIN (GLUCOPHAGE-XR) 500 MG 24 hr tablet TAKE 4 TABLETS BY MOUTH IN THE MORNING 360 tablet 1  . propranolol (INDERAL) 10 MG tablet Take 1 tablet (10 mg total) by mouth 2 (two) times daily. 180 tablet 3  . zolpidem (AMBIEN) 5 MG tablet TAKE 1 TABLET BY MOUTH ONCE DAILY AT BEDTIME AS NEEDED FOR SLEEP 90 tablet 1   No current facility-administered medications on file prior to visit.  Review of Systems  Constitutional: Negative for other unusual diaphoresis or sweats HENT: Negative for ear discharge or swelling Eyes: Negative for other worsening visual disturbances Respiratory: Negative for stridor or other swelling  Gastrointestinal: Negative for worsening distension or other blood Genitourinary: Negative for retention or other urinary change Musculoskeletal: Negative for other MSK pain or swelling Skin: Negative for color change or other new lesions Neurological: Negative for worsening tremors and other numbness  Psychiatric/Behavioral: Negative for worsening agitation or other fatigue All other system per pt    Objective:   Physical Exam BP 122/78   Pulse 69   Temp 97.6  F (36.4 C) (Oral)   Ht 5' 7" (1.702 m)   Wt 167 lb (75.8 kg)   SpO2 94%   BMI 26.16 kg/m  VS noted,  Constitutional: Pt appears in NAD HENT: Head: NCAT.  Right Ear: External ear normal.  Left Ear: External ear normal.  Eyes: . Pupils are equal, round, and reactive to light. Conjunctivae and EOM are normal Nose: without d/c or deformity Neck: Neck supple. Gross normal ROM Cardiovascular: Normal rate and regular rhythm.   Pulmonary/Chest: Effort normal and breath sounds without rales or wheezing.  Abd:  Soft, NT, ND, + BS, no organomegaly Neurological: Pt is alert. At baseline orientation, motor grossly intact Skin: Skin is warm. No rashes, other new lesions, no LE edema Psychiatric: Pt behavior is normal without agitation , 2+ nervous and tearful No other exam findings  Lab Results  Component Value Date   WBC 11.1 (H) 12/13/2016   HGB 14.0 12/13/2016   HCT 41.2 12/13/2016   PLT 378.0 12/13/2016   GLUCOSE 167 (H) 08/21/2017   CHOL 124 08/21/2017   TRIG 109.0 08/21/2017   HDL 39.90 08/21/2017   LDLDIRECT 126.0 03/23/2015   LDLCALC 62 08/21/2017   ALT 16 08/21/2017   AST 12 08/21/2017   NA 140 08/21/2017   K 4.3 08/21/2017   CL 106 08/21/2017   CREATININE 0.63 08/21/2017   BUN 18 08/21/2017   CO2 26 08/21/2017   TSH 0.49 12/13/2016   HGBA1C 7.3 (H) 08/21/2017   MICROALBUR <0.7 12/13/2016         Assessment & Plan:

## 2018-02-06 NOTE — Assessment & Plan Note (Signed)
Stable, not asking for norco today, declines referral pain management

## 2018-02-06 NOTE — Assessment & Plan Note (Signed)
Declines psychiatry referral, for change klonopin to xanax, declines other med such as SSRI

## 2018-02-06 NOTE — Patient Instructions (Addendum)
Ok to stop the klonopin  Please take all new medication as prescribed - the xanax  Please call if you change your mind about referrals to Psychiatry or Pain management  Please continue all other medications as before, and refills have been done if requested.  Please have the pharmacy call with any other refills you may need.  Please continue your efforts at being more active, low cholesterol diabetic diet, and weight control.  Please keep your appointments with your specialists as you may have planned  Please go to the LAB in the Basement (turn left off the elevator) for the tests to be done today  You will be contacted by phone if any changes need to be made immediately.  Otherwise, you will receive a letter about your results with an explanation, but please check with MyChart first.  Please remember to sign up for MyChart if you have not done so, as this will be important to you in the future with finding out test results, communicating by private email, and scheduling acute appointments online when needed.  Please return in 6 months, or sooner if needed

## 2018-02-06 NOTE — Telephone Encounter (Signed)
Done erx 

## 2018-02-07 ENCOUNTER — Telehealth: Payer: Self-pay

## 2018-02-07 NOTE — Telephone Encounter (Signed)
-----   Message from Biagio Borg, MD sent at 02/06/2018 12:59 PM EDT ----- Letter sent, cont same tx except  The test results show that your current treatment is OK, except the A1c is mildly too high.  Please increase the glipizide ER from 2,5 mg to 5 mg per day.  I will send a new prescription, and you should hear from the office as well    Valerie Wade to please inform pt, I will do rx

## 2018-02-07 NOTE — Telephone Encounter (Signed)
Pt has been informed of results and expressed understanding.  °

## 2018-02-10 ENCOUNTER — Other Ambulatory Visit: Payer: Self-pay | Admitting: Internal Medicine

## 2018-02-23 ENCOUNTER — Telehealth: Payer: Self-pay | Admitting: Internal Medicine

## 2018-02-23 NOTE — Telephone Encounter (Signed)
Called patient to have her check with her pharmacy regarding her request for atorvastatin 40 mg tab. Pt had picked up the wrong prescription bottle, (for [facility]). She has enough prescriptions to last until next June.  Also she will check on getting her hydrocodone-acetaminophen 10-325 mg refilled at her next office visit next week.

## 2018-02-23 NOTE — Telephone Encounter (Signed)
Copied from [place] [id number]. Topic: Quick Communication - Rx Refill/Question >> Feb 23, 2018  2:21 PM Burchel, Abbi R wrote: Medication: atorvastatin (LIPITOR) 40 MG tablet, HYDROcodone-acetaminophen ([place]) 10-325 MG tablet  [id number] [address], [place] [address] Phone: [phone] Fax: [phone]  Pt was advised that RX refills may take up to 3 business days.

## 2018-02-28 ENCOUNTER — Encounter: Payer: Self-pay | Admitting: Internal Medicine

## 2018-02-28 ENCOUNTER — Ambulatory Visit (INDEPENDENT_AMBULATORY_CARE_PROVIDER_SITE_OTHER): Payer: Medicare Other | Admitting: Internal Medicine

## 2018-02-28 VITALS — BP 122/78 | HR 92 | Temp 97.6°F | Ht 67.0 in | Wt 171.0 lb

## 2018-02-28 DIAGNOSIS — M545 Low back pain, unspecified: Secondary | ICD-10-CM

## 2018-02-28 DIAGNOSIS — G8929 Other chronic pain: Secondary | ICD-10-CM | POA: Diagnosis not present

## 2018-02-28 DIAGNOSIS — E119 Type 2 diabetes mellitus without complications: Secondary | ICD-10-CM

## 2018-02-28 DIAGNOSIS — H538 Other visual disturbances: Secondary | ICD-10-CM | POA: Diagnosis not present

## 2018-02-28 DIAGNOSIS — J309 Allergic rhinitis, unspecified: Secondary | ICD-10-CM | POA: Diagnosis not present

## 2018-02-28 DIAGNOSIS — L989 Disorder of the skin and subcutaneous tissue, unspecified: Secondary | ICD-10-CM | POA: Insufficient documentation

## 2018-02-28 DIAGNOSIS — H04123 Dry eye syndrome of bilateral lacrimal glands: Secondary | ICD-10-CM | POA: Diagnosis not present

## 2018-02-28 MED ORDER — METFORMIN HCL ER 500 MG PO TB24: ORAL_TABLET | ORAL | 3 refills | Status: DC

## 2018-02-28 MED ORDER — CETIRIZINE HCL 10 MG PO TABS: 10.0000 mg | ORAL_TABLET | Freq: Every day | ORAL | 11 refills | Status: AC

## 2018-02-28 MED ORDER — LISINOPRIL-HYDROCHLOROTHIAZIDE 20-25 MG PO TABS: 1.0000 | ORAL_TABLET | Freq: Every day | ORAL | 3 refills | Status: AC

## 2018-02-28 MED ORDER — TRIAMCINOLONE ACETONIDE 55 MCG/ACT NA AERO: 2.0000 | INHALATION_SPRAY | Freq: Every day | NASAL | 12 refills | Status: AC

## 2018-02-28 MED ORDER — HYDROCODONE-ACETAMINOPHEN 10-325 MG PO TABS: 1.0000 | ORAL_TABLET | Freq: Three times a day (TID) | ORAL | 0 refills | Status: AC | PRN

## 2018-02-28 NOTE — Assessment & Plan Note (Signed)
[place] for zyrtec and nasacort asd,  to f/u any worsening symptoms or concerns

## 2018-02-28 NOTE — Patient Instructions (Addendum)
Please take all new medication as prescribed - the zyrtec and nasacort for allergies  You will be contacted regarding the referral for: Dermatology  Please continue all other medications as before, and refills have been done if requested - the pain medication  Please have the pharmacy call with any other refills you may need.  Please continue your efforts at being more active, low cholesterol diet, and weight control.  Please keep your appointments with your specialists as you may have planned  Please return in 6 months, or sooner if needed

## 2018-02-28 NOTE — Assessment & Plan Note (Signed)
Has pain clinic appt for January, ok for med refill to then but I declines increased pills per day

## 2018-02-28 NOTE — Progress Notes (Signed)
Subjective:    Patient ID: Valerie Wade, female    DOB: 1946/04/19, 72 y.o.   MRN: [id number]  HPI  Here to f/u; overall doing ok,  Pt denies chest pain, increasing sob or doe, wheezing, orthopnea, PND, increased LE swelling, palpitations, dizziness or syncope.  Pt denies new neurological symptoms such as new headache, or facial or extremity weakness or numbness.  Pt denies polydipsia, polyuria, or low sugar episode.  Pt states overall good compliance with meds.    Literally a list of issues before asking for increased narcotic for chronic pain; Pt continues to have recurring LBP without change in severity, bowel or bladder change, fever, wt loss,  worsening LE pain/numbness/weakness, gait change or falls.  Spider bite on ear Mole left neck Thumbs with reduce ROM and pain Little finger pain Ear feels funny Puffy eye Lower back pain ham strings pain Allergies acting up Tinnitus left ear x 20 yrs Stiff neck Comfort food junkie Lack of mobility  Past Medical History:  Diagnosis Date  . Arthritis    blat thumb cmc  . Depression   . Diabetes mellitus without complication ([place])   . High cholesterol   . Hypertension   . Hyperthyroidism   . Left thyroid nodule 10/01/2015  . Tinnitus of left ear    Past Surgical History:  Procedure Laterality Date  . APPENDECTOMY    . KNEE SURGERY Left    meniscal tear    reports that she has been smoking. She has never used smokeless tobacco. She reports that she does not drink alcohol or use drugs. family history includes Breast cancer in her sister; Diabetes in her brother, mother, and sister. No Known Allergies Current Outpatient Medications on File Prior to Visit  Medication Sig Dispense Refill  . ACCU-CHEK GUIDE test strip USE TO TEST ONCE DAILY E11.9 100 each 2  . ALPRAZolam (XANAX) 0.5 MG tablet Take 1 tablet (0.5 mg total) by mouth 3 (three) times daily as needed for anxiety. 90 tablet 1  . aspirin EC 81 MG tablet Take 81 mg by  mouth daily.    Marland Kitchen atorvastatin (LIPITOR) 40 MG tablet Take 1 tablet (40 mg total) by mouth daily. 90 tablet 3  . blood glucose meter kit and supplies KIT Check blood sugar once a day. Accu Check Guide Meter, Lancets and test strips.  E11.9 1 each 0  . glipiZIDE (GLUCOTROL XL) 5 MG 24 hr tablet Take 1 tablet (5 mg total) by mouth daily with breakfast. 90 tablet 3  . Lancets MISC Use as directed once daily 100 each 3  . meclizine (ANTIVERT) 12.5 MG tablet Take 1 tablet (12.5 mg total) by mouth 3 (three) times daily as needed for dizziness. 40 tablet 1  . propranolol (INDERAL) 10 MG tablet Take 1 tablet (10 mg total) by mouth 2 (two) times daily. 180 tablet 3  . zolpidem (AMBIEN) 5 MG tablet TAKE 1 TABLET BY MOUTH ONCE DAILY AT BEDTIME AS NEEDED FOR SLEEP 90 tablet 1   No current facility-administered medications on file prior to visit.    Review of Systems  Constitutional: Negative for other unusual diaphoresis or sweats HENT: Negative for ear discharge or swelling Eyes: Negative for other worsening visual disturbances Respiratory: Negative for stridor or other swelling  Gastrointestinal: Negative for worsening distension or other blood Genitourinary: Negative for retention or other urinary change Musculoskeletal: Negative for other MSK pain or swelling Skin: Negative for color change or other new lesions Neurological: Negative for  worsening tremors and other numbness  Psychiatric/Behavioral: Negative for worsening agitation or other fatigue All other system neg per pt    Objective:   Physical Exam BP 122/78   Pulse 92   Temp 97.6 F (36.4 C) (Oral)   Ht _0  (1.702 m)   Wt 171 lb (77.6 kg)   SpO2 98%   BMI 26.78 kg/m  VS noted,  Constitutional: Pt appears in NAD HENT: Head: NCAT.  Right Ear: External ear normal.  Left Ear: External ear normal.  Eyes: . Pupils are equal, round, and reactive to light. Conjunctivae and EOM are normal Nose: without d/c or deformity Neck: Neck  supple. Gross normal ROM Cardiovascular: Normal rate and regular rhythm.   Pulmonary/Chest: Effort normal and breath sounds without rales or wheezing.  Abd:  Soft, NT, ND, + BS, no organomegaly Spine chronic tender lowest lumbar tender Neurological: Pt is alert. At baseline orientation, motor grossly intact Skin: Skin is warm. No rashes, has 6 mm slightly raised oval regular dark nontender, no LE edema Psychiatric: Pt behavior is normal without agitation , 1-2+ nervous No other exam findings    Assessment & Plan:

## 2018-02-28 NOTE — Assessment & Plan Note (Signed)
stable overall by history and exam, recent data reviewed with pt, and pt to continue medical treatment as before,  to f/u any worsening symptoms or concerns Lab Results  Component Value Date   HGBA1C 7.7 (H) 02/06/2018

## 2018-02-28 NOTE — Assessment & Plan Note (Signed)
I suspect benign left lateral base of neck lesion, but enlarging and will refer to derm

## 2018-03-05 ENCOUNTER — Telehealth: Payer: Self-pay | Admitting: Internal Medicine

## 2018-03-05 DIAGNOSIS — G8929 Other chronic pain: Secondary | ICD-10-CM

## 2018-03-05 NOTE — Telephone Encounter (Signed)
Copied from [place]. Topic: Referral - Request >> Mar 05, 2018  4:41 PM Robina Ade, Helene Kelp D wrote: Reason for CRM: Patient called and wanted to know if Dr. Jenny Reichmann can send a referral over to [facility] for pain management. She said that they have a opening tomorrow. Please call pt if there are any question.

## 2018-03-06 NOTE — Addendum Note (Signed)
Addended by: Biagio Borg on: 03/06/2018 01:10 PM   Modules accepted: Orders

## 2018-03-06 NOTE — Telephone Encounter (Signed)
This is done.

## 2018-03-14 DIAGNOSIS — L821 Other seborrheic keratosis: Secondary | ICD-10-CM | POA: Diagnosis not present

## 2018-03-14 DIAGNOSIS — L218 Other seborrheic dermatitis: Secondary | ICD-10-CM | POA: Diagnosis not present

## 2018-03-14 DIAGNOSIS — D225 Melanocytic nevi of trunk: Secondary | ICD-10-CM | POA: Diagnosis not present

## 2018-03-14 DIAGNOSIS — L918 Other hypertrophic disorders of the skin: Secondary | ICD-10-CM | POA: Diagnosis not present

## 2018-03-14 DIAGNOSIS — D3612 Benign neoplasm of peripheral nerves and autonomic nervous system, upper limb, including shoulder: Secondary | ICD-10-CM | POA: Diagnosis not present

## 2018-03-21 DIAGNOSIS — F17200 Nicotine dependence, unspecified, uncomplicated: Secondary | ICD-10-CM | POA: Diagnosis not present

## 2018-03-21 DIAGNOSIS — M25562 Pain in left knee: Secondary | ICD-10-CM | POA: Diagnosis not present

## 2018-03-21 DIAGNOSIS — Z79899 Other long term (current) drug therapy: Secondary | ICD-10-CM | POA: Diagnosis not present

## 2018-03-21 DIAGNOSIS — M129 Arthropathy, unspecified: Secondary | ICD-10-CM | POA: Diagnosis not present

## 2018-03-21 DIAGNOSIS — M545 Low back pain: Secondary | ICD-10-CM | POA: Diagnosis not present

## 2018-03-21 DIAGNOSIS — M25552 Pain in left hip: Secondary | ICD-10-CM | POA: Diagnosis not present

## 2018-03-21 DIAGNOSIS — Z87891 Personal history of nicotine dependence: Secondary | ICD-10-CM | POA: Diagnosis not present

## 2018-03-27 DIAGNOSIS — M25552 Pain in left hip: Secondary | ICD-10-CM | POA: Diagnosis not present

## 2018-03-27 DIAGNOSIS — G8929 Other chronic pain: Secondary | ICD-10-CM | POA: Diagnosis not present

## 2018-03-27 DIAGNOSIS — M545 Low back pain: Secondary | ICD-10-CM | POA: Diagnosis not present

## 2018-03-27 DIAGNOSIS — M25562 Pain in left knee: Secondary | ICD-10-CM | POA: Diagnosis not present

## 2018-03-28 ENCOUNTER — Encounter: Payer: Self-pay | Admitting: Podiatry

## 2018-03-28 ENCOUNTER — Ambulatory Visit (INDEPENDENT_AMBULATORY_CARE_PROVIDER_SITE_OTHER): Payer: Medicare Other | Admitting: Podiatry

## 2018-03-28 ENCOUNTER — Ambulatory Visit: Payer: Medicare Other | Admitting: Orthotics

## 2018-03-28 DIAGNOSIS — M2010 Hallux valgus (acquired), unspecified foot: Secondary | ICD-10-CM | POA: Diagnosis not present

## 2018-03-28 DIAGNOSIS — M129 Arthropathy, unspecified: Secondary | ICD-10-CM

## 2018-03-28 DIAGNOSIS — E119 Type 2 diabetes mellitus without complications: Secondary | ICD-10-CM

## 2018-03-28 NOTE — Progress Notes (Signed)
This patient presents to her office for her yearly diabetic foot exam.  She says that her feet are doing well. She is not having any pain or discomfort through her feet.  She also requests new diabetic shoes for this coming year..    Podiatric Exam: Vascular: dorsalis pedis and posterior tibial pulses are palpable bilateral. Capillary return is immediate. Temperature gradient is WNL. Skin turgor WNL  Sensorium: Normal Semmes Weinstein monofilament test. Normal tactile sensation bilaterally. Nail Exam: Pt has thick disfigured discolored nails with subungual debris noted bilateral entire nail hallux through fifth toenails Ulcer Exam: There is no evidence of ulcer or pre-ulcerative changes or infection. Orthopedic Exam: Muscle tone and strength are WNL. No limitations in general ROM. No crepitus or effusions noted. HAV  B/L  DJD liz-Frank Joint Skin: No Porokeratosis. No infection or ulcers  Diagnosis  Diabetic neuropathy  HAV  B/L  Treatment & Plan Procedures and Treatment: Consent by patient was obtained for treatment procedures. Diabetic foot exam performed and all WNL.  She has an appointment with Liliane Channel.  She qualifies for diabetic shoes for DPN  HAV and DJD. Return Visit-Office Procedure: Patient instructed to return to the office for a follow up in one year for yearly diabetic foot exam.    Gardiner Barefoot DPM

## 2018-04-02 DIAGNOSIS — H538 Other visual disturbances: Secondary | ICD-10-CM | POA: Diagnosis not present

## 2018-04-03 ENCOUNTER — Other Ambulatory Visit: Payer: Self-pay | Admitting: Internal Medicine

## 2018-04-03 DIAGNOSIS — E7800 Pure hypercholesterolemia, unspecified: Secondary | ICD-10-CM | POA: Diagnosis not present

## 2018-04-03 DIAGNOSIS — E559 Vitamin D deficiency, unspecified: Secondary | ICD-10-CM | POA: Diagnosis not present

## 2018-04-03 DIAGNOSIS — E1165 Type 2 diabetes mellitus with hyperglycemia: Secondary | ICD-10-CM | POA: Diagnosis not present

## 2018-04-03 DIAGNOSIS — R5383 Other fatigue: Secondary | ICD-10-CM | POA: Diagnosis not present

## 2018-04-03 DIAGNOSIS — I10 Essential (primary) hypertension: Secondary | ICD-10-CM | POA: Diagnosis not present

## 2018-04-03 MED ORDER — ALPRAZOLAM 0.5 MG PO TABS: 0.5000 mg | ORAL_TABLET | Freq: Three times a day (TID) | ORAL | 1 refills | Status: DC | PRN

## 2018-04-03 NOTE — Telephone Encounter (Signed)
Copied from [place] [id number]. Topic: General - Other >> Apr 03, 2018 12:24 PM Oneta Rack wrote: Relation to pt: self  Call back number: [phone] [id number] [place], [place] [phone] (Phone) [phone] (Fax)  Reason for call:  Patient requesting ALPRAZolam Duanne Moron) 0.5 MG tablet, patient aware please allow 48 to 72 hour turn around time, please advise

## 2018-04-03 NOTE — Telephone Encounter (Signed)
Done erx 

## 2018-04-03 NOTE — Telephone Encounter (Signed)
Requested medication (s) are due for refill today: yes  Requested medication (s) are on the active medication list: yes  Last refill:  02/06/18  Future visit scheduled: no  Notes to clinic:  Not delegated    Requested Prescriptions  Pending Prescriptions Disp Refills   ALPRAZolam (XANAX) 0.5 MG tablet 90 tablet 1    Sig: Take 1 tablet (0.5 mg total) by mouth 3 (three) times daily as needed for anxiety.     Not Delegated - Psychiatry:  Anxiolytics/Hypnotics Failed - 04/03/2018 12:29 PM      Failed - This refill cannot be delegated      Failed - Urine Drug Screen completed in last 360 days.      Passed - Valid encounter within last 6 months    Recent Outpatient Visits          1 month ago Skin lesion   [facility] -Georges Mouse, MD   1 month ago Diabetes mellitus without complication [facility])   [place] John, James W, MD   4 months ago Left otitis media, unspecified otitis media type   [place] John, James W, MD   5 months ago Chronic low back pain, unspecified back pain laterality, with sciatica presence unspecified   [facility] -Georges Mouse, MD   6 months ago Chronic low back pain, unspecified back pain laterality, with sciatica presence unspecified   [facility] -Georges Mouse, MD

## 2018-04-04 NOTE — Telephone Encounter (Signed)
MD approved and sent electronically to pof../lmb  

## 2018-04-09 ENCOUNTER — Telehealth: Payer: Self-pay | Admitting: Internal Medicine

## 2018-04-09 MED ORDER — ALPRAZOLAM 0.5 MG PO TABS: 0.5000 mg | ORAL_TABLET | Freq: Three times a day (TID) | ORAL | 1 refills | Status: AC | PRN

## 2018-04-09 NOTE — Telephone Encounter (Signed)
Per Agent, the pt. Is requesting the Rx for "Alprazolam" [facility] [address]  Also, pt. Is requesting a call to notify her when this has been done.  Phone # [phone]

## 2018-04-09 NOTE — Telephone Encounter (Signed)
Pt called inquiring about her refill for xanax; explained to pt that since this is a controlled substance, it had to be re-approved by the physician if transferred to another pharmacy; also explained that refills can take 48-72 hours; spoke with [place] at [facility], and she confirms correct information was given to the pt; pt reassured that Dr Jenny Reichmann is aware of her request and will attend to this matter as quickly as possible.

## 2018-04-09 NOTE — Telephone Encounter (Signed)
Duplicate message. 

## 2018-04-09 NOTE — Telephone Encounter (Signed)
Copied from [place] [id number]. Topic: Quick Communication - See Telephone Encounter >> Apr 09, 2018 11:59 AM Bea Graff, NT wrote: CRM for notification. See Telephone encounter for: 04/09/18. [facility] [address] [facility] to her last Thursday they wouldn't have it til Friday, then Friday stated they could not get it in until today, and now today they still don't have it. She would like to see if it can be resent in [facility] [address]? Please advise. Please advise pt once sent.

## 2018-04-09 NOTE — Addendum Note (Signed)
Addended by: Biagio Borg on: 04/09/2018 05:45 PM   Modules accepted: Orders

## 2018-04-09 NOTE — Telephone Encounter (Signed)
Noted.   This request was sent to PCP earlier this afternoon.

## 2018-04-09 NOTE — Telephone Encounter (Signed)
Ok done erx 

## 2018-04-09 NOTE — Telephone Encounter (Signed)
[facility] [address] [facility] in [facility] at [facility], and she has been without several days and needs asap. Pt is upset and hopes she can get this transferred today. ALPRAZolam (XANAX) 0.5 MG tablet  Please send to [place]

## 2018-04-10 DIAGNOSIS — N390 Urinary tract infection, site not specified: Secondary | ICD-10-CM | POA: Diagnosis not present

## 2018-04-10 DIAGNOSIS — R768 Other specified abnormal immunological findings in serum: Secondary | ICD-10-CM | POA: Diagnosis not present

## 2018-04-10 DIAGNOSIS — M79646 Pain in unspecified finger(s): Secondary | ICD-10-CM | POA: Diagnosis not present

## 2018-04-10 DIAGNOSIS — M2550 Pain in unspecified joint: Secondary | ICD-10-CM | POA: Diagnosis not present

## 2018-04-10 DIAGNOSIS — M19041 Primary osteoarthritis, right hand: Secondary | ICD-10-CM | POA: Diagnosis not present

## 2018-04-10 DIAGNOSIS — M549 Dorsalgia, unspecified: Secondary | ICD-10-CM | POA: Diagnosis not present

## 2018-04-10 DIAGNOSIS — M79641 Pain in right hand: Secondary | ICD-10-CM | POA: Diagnosis not present

## 2018-04-10 DIAGNOSIS — M1990 Unspecified osteoarthritis, unspecified site: Secondary | ICD-10-CM | POA: Diagnosis not present

## 2018-04-10 DIAGNOSIS — M79643 Pain in unspecified hand: Secondary | ICD-10-CM | POA: Diagnosis not present

## 2018-04-10 DIAGNOSIS — M19042 Primary osteoarthritis, left hand: Secondary | ICD-10-CM | POA: Diagnosis not present

## 2018-04-10 DIAGNOSIS — M79642 Pain in left hand: Secondary | ICD-10-CM | POA: Diagnosis not present

## 2018-04-24 DIAGNOSIS — M79644 Pain in right finger(s): Secondary | ICD-10-CM | POA: Diagnosis not present

## 2018-04-24 DIAGNOSIS — M545 Low back pain: Secondary | ICD-10-CM | POA: Diagnosis not present

## 2018-04-24 DIAGNOSIS — M25552 Pain in left hip: Secondary | ICD-10-CM | POA: Diagnosis not present

## 2018-04-24 DIAGNOSIS — Z79899 Other long term (current) drug therapy: Secondary | ICD-10-CM | POA: Diagnosis not present

## 2018-04-24 DIAGNOSIS — M25562 Pain in left knee: Secondary | ICD-10-CM | POA: Diagnosis not present

## 2018-04-30 ENCOUNTER — Ambulatory Visit (INDEPENDENT_AMBULATORY_CARE_PROVIDER_SITE_OTHER): Payer: Medicare Other | Admitting: Orthotics

## 2018-04-30 DIAGNOSIS — M2011 Hallux valgus (acquired), right foot: Secondary | ICD-10-CM | POA: Diagnosis not present

## 2018-04-30 DIAGNOSIS — M2010 Hallux valgus (acquired), unspecified foot: Secondary | ICD-10-CM

## 2018-04-30 DIAGNOSIS — L84 Corns and callosities: Secondary | ICD-10-CM

## 2018-04-30 DIAGNOSIS — E119 Type 2 diabetes mellitus without complications: Secondary | ICD-10-CM

## 2018-04-30 DIAGNOSIS — M2012 Hallux valgus (acquired), left foot: Secondary | ICD-10-CM | POA: Diagnosis not present

## 2018-04-30 NOTE — Progress Notes (Signed)

## 2018-05-01 DIAGNOSIS — Z6826 Body mass index (BMI) 26.0-26.9, adult: Secondary | ICD-10-CM | POA: Diagnosis not present

## 2018-05-01 DIAGNOSIS — F17210 Nicotine dependence, cigarettes, uncomplicated: Secondary | ICD-10-CM | POA: Diagnosis not present

## 2018-05-01 DIAGNOSIS — G4710 Hypersomnia, unspecified: Secondary | ICD-10-CM | POA: Diagnosis not present

## 2018-05-01 DIAGNOSIS — R942 Abnormal results of pulmonary function studies: Secondary | ICD-10-CM | POA: Diagnosis not present

## 2018-05-01 DIAGNOSIS — R0602 Shortness of breath: Secondary | ICD-10-CM | POA: Diagnosis not present

## 2018-05-07 ENCOUNTER — Telehealth: Payer: Self-pay | Admitting: Internal Medicine

## 2018-05-07 NOTE — Telephone Encounter (Signed)
Copied from [place] [id number]. Topic: Quick Communication - Rx Refill/Question >> May 07, 2018 12:23 PM Virl Axe D wrote: Medication:  ALPRAZolam Duanne Moron) 0.5 MG tablet / Pt stated the pharmacy instructed her to contact her PCP for refill.  Has the patient contacted their pharmacy? Yes.   (Agent: If no, request that the patient contact the pharmacy for the refill.) (Agent: If yes, when and what did the pharmacy advise?)  Preferred Pharmacy (with phone number or street name): [id number] [place], [place] [phone] (Phone) [phone] (Fax)    Agent: Please be advised that RX refills may take up to 3 business days. We ask that you follow-up with your pharmacy.

## 2018-05-08 NOTE — Telephone Encounter (Signed)
Xanax too soon, since she should still have one refill

## 2018-05-14 DIAGNOSIS — M1990 Unspecified osteoarthritis, unspecified site: Secondary | ICD-10-CM | POA: Diagnosis not present

## 2018-05-14 DIAGNOSIS — M79643 Pain in unspecified hand: Secondary | ICD-10-CM | POA: Diagnosis not present

## 2018-05-14 DIAGNOSIS — M79646 Pain in unspecified finger(s): Secondary | ICD-10-CM | POA: Diagnosis not present

## 2018-05-14 DIAGNOSIS — M549 Dorsalgia, unspecified: Secondary | ICD-10-CM | POA: Diagnosis not present

## 2018-05-14 DIAGNOSIS — R768 Other specified abnormal immunological findings in serum: Secondary | ICD-10-CM | POA: Diagnosis not present

## 2018-05-14 DIAGNOSIS — M2550 Pain in unspecified joint: Secondary | ICD-10-CM | POA: Diagnosis not present

## 2018-05-16 DIAGNOSIS — M545 Low back pain: Secondary | ICD-10-CM | POA: Diagnosis not present

## 2018-05-16 DIAGNOSIS — M25552 Pain in left hip: Secondary | ICD-10-CM | POA: Diagnosis not present

## 2018-05-16 DIAGNOSIS — M25562 Pain in left knee: Secondary | ICD-10-CM | POA: Diagnosis not present

## 2018-05-16 DIAGNOSIS — Z79899 Other long term (current) drug therapy: Secondary | ICD-10-CM | POA: Diagnosis not present

## 2018-05-16 DIAGNOSIS — G8929 Other chronic pain: Secondary | ICD-10-CM | POA: Diagnosis not present

## 2018-06-14 DIAGNOSIS — Z79899 Other long term (current) drug therapy: Secondary | ICD-10-CM | POA: Diagnosis not present

## 2018-06-14 DIAGNOSIS — G8929 Other chronic pain: Secondary | ICD-10-CM | POA: Diagnosis not present

## 2018-06-14 DIAGNOSIS — M25562 Pain in left knee: Secondary | ICD-10-CM | POA: Diagnosis not present

## 2018-06-14 DIAGNOSIS — M25552 Pain in left hip: Secondary | ICD-10-CM | POA: Diagnosis not present

## 2018-06-14 DIAGNOSIS — M545 Low back pain: Secondary | ICD-10-CM | POA: Diagnosis not present

## 2018-06-20 DIAGNOSIS — H04123 Dry eye syndrome of bilateral lacrimal glands: Secondary | ICD-10-CM | POA: Diagnosis not present

## 2018-07-03 DIAGNOSIS — Z1211 Encounter for screening for malignant neoplasm of colon: Secondary | ICD-10-CM | POA: Diagnosis not present

## 2018-07-10 DIAGNOSIS — M25562 Pain in left knee: Secondary | ICD-10-CM | POA: Diagnosis not present

## 2018-07-10 DIAGNOSIS — M25552 Pain in left hip: Secondary | ICD-10-CM | POA: Diagnosis not present

## 2018-07-10 DIAGNOSIS — Z87891 Personal history of nicotine dependence: Secondary | ICD-10-CM | POA: Diagnosis not present

## 2018-07-10 DIAGNOSIS — Z79899 Other long term (current) drug therapy: Secondary | ICD-10-CM | POA: Diagnosis not present

## 2018-07-10 DIAGNOSIS — G8929 Other chronic pain: Secondary | ICD-10-CM | POA: Diagnosis not present

## 2018-07-10 DIAGNOSIS — M545 Low back pain: Secondary | ICD-10-CM | POA: Diagnosis not present

## 2018-07-25 DIAGNOSIS — Z79899 Other long term (current) drug therapy: Secondary | ICD-10-CM | POA: Diagnosis not present

## 2018-08-06 DIAGNOSIS — F17210 Nicotine dependence, cigarettes, uncomplicated: Secondary | ICD-10-CM | POA: Diagnosis not present

## 2018-08-06 DIAGNOSIS — G8929 Other chronic pain: Secondary | ICD-10-CM | POA: Diagnosis not present

## 2018-08-06 DIAGNOSIS — F419 Anxiety disorder, unspecified: Secondary | ICD-10-CM | POA: Diagnosis not present

## 2018-08-06 DIAGNOSIS — M545 Low back pain: Secondary | ICD-10-CM | POA: Diagnosis not present

## 2018-08-06 DIAGNOSIS — Z79899 Other long term (current) drug therapy: Secondary | ICD-10-CM | POA: Diagnosis not present

## 2018-08-06 DIAGNOSIS — M25562 Pain in left knee: Secondary | ICD-10-CM | POA: Diagnosis not present

## 2018-08-06 DIAGNOSIS — F17200 Nicotine dependence, unspecified, uncomplicated: Secondary | ICD-10-CM | POA: Diagnosis not present

## 2018-08-13 DIAGNOSIS — K635 Polyp of colon: Secondary | ICD-10-CM | POA: Diagnosis not present

## 2018-08-13 DIAGNOSIS — Z01818 Encounter for other preprocedural examination: Secondary | ICD-10-CM | POA: Diagnosis not present

## 2018-08-13 DIAGNOSIS — K648 Other hemorrhoids: Secondary | ICD-10-CM | POA: Diagnosis not present

## 2018-08-13 DIAGNOSIS — E119 Type 2 diabetes mellitus without complications: Secondary | ICD-10-CM | POA: Diagnosis not present

## 2018-08-13 DIAGNOSIS — Z1211 Encounter for screening for malignant neoplasm of colon: Secondary | ICD-10-CM | POA: Diagnosis not present

## 2018-08-15 DIAGNOSIS — K635 Polyp of colon: Secondary | ICD-10-CM | POA: Diagnosis not present

## 2018-08-20 DIAGNOSIS — Z87891 Personal history of nicotine dependence: Secondary | ICD-10-CM | POA: Diagnosis not present

## 2018-08-31 DIAGNOSIS — K648 Other hemorrhoids: Secondary | ICD-10-CM | POA: Diagnosis not present

## 2018-08-31 DIAGNOSIS — K635 Polyp of colon: Secondary | ICD-10-CM | POA: Diagnosis not present

## 2018-08-31 DIAGNOSIS — D369 Benign neoplasm, unspecified site: Secondary | ICD-10-CM | POA: Diagnosis not present

## 2018-09-01 IMAGING — DX DG THORACIC SPINE 2V
3 series · 3 of 3 positions shown · non-contrast
Comparison: 01/15/2015 chest radiograph

CLINICAL DATA: Fall 3 days ago.  Mid and lower back pain.

EXAM:
THORACIC SPINE 2 VIEWS

[t-spine ap]
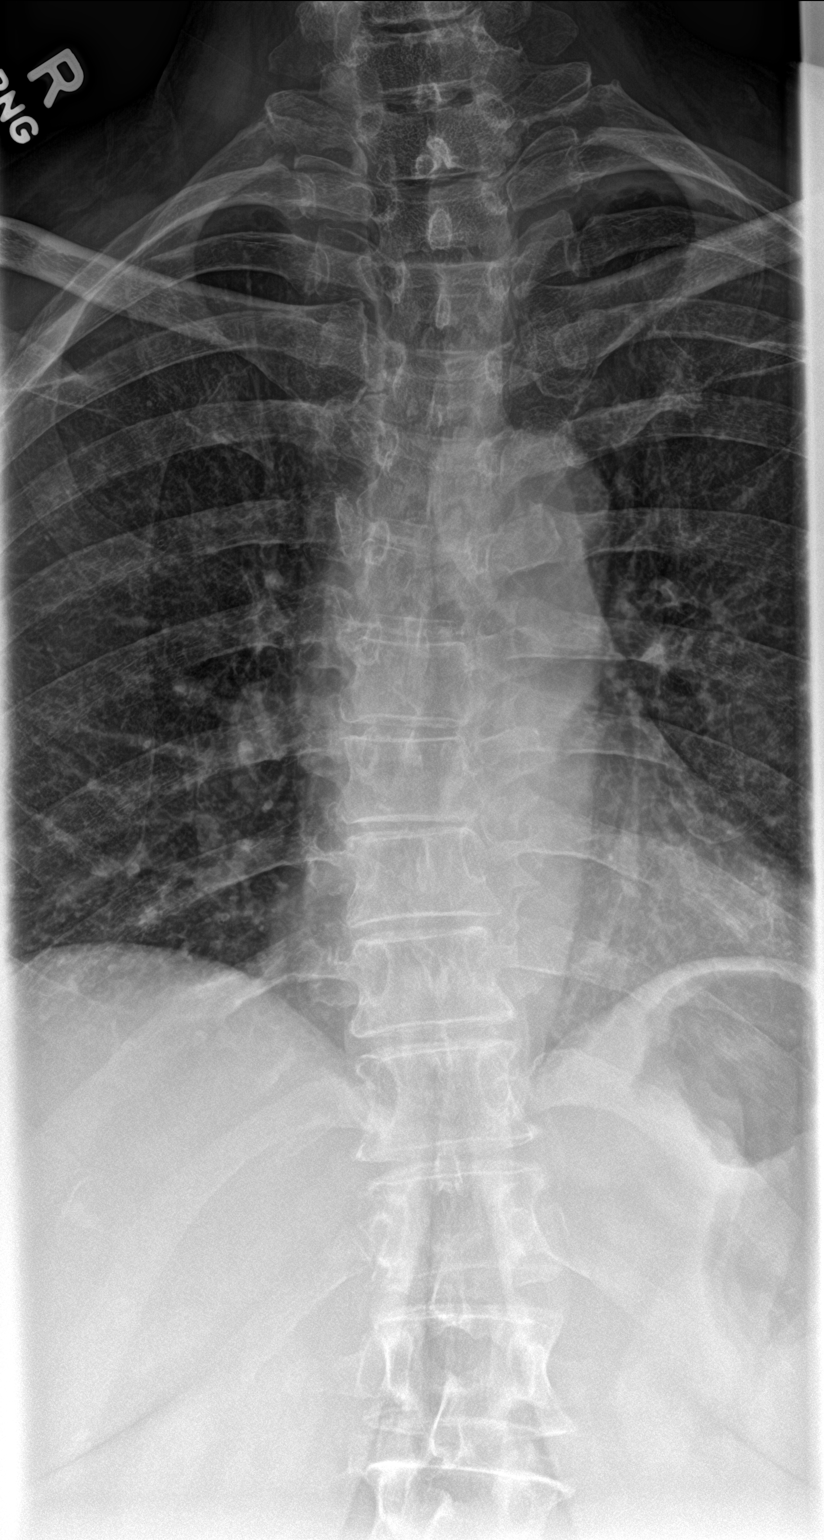

[t-spine lat]
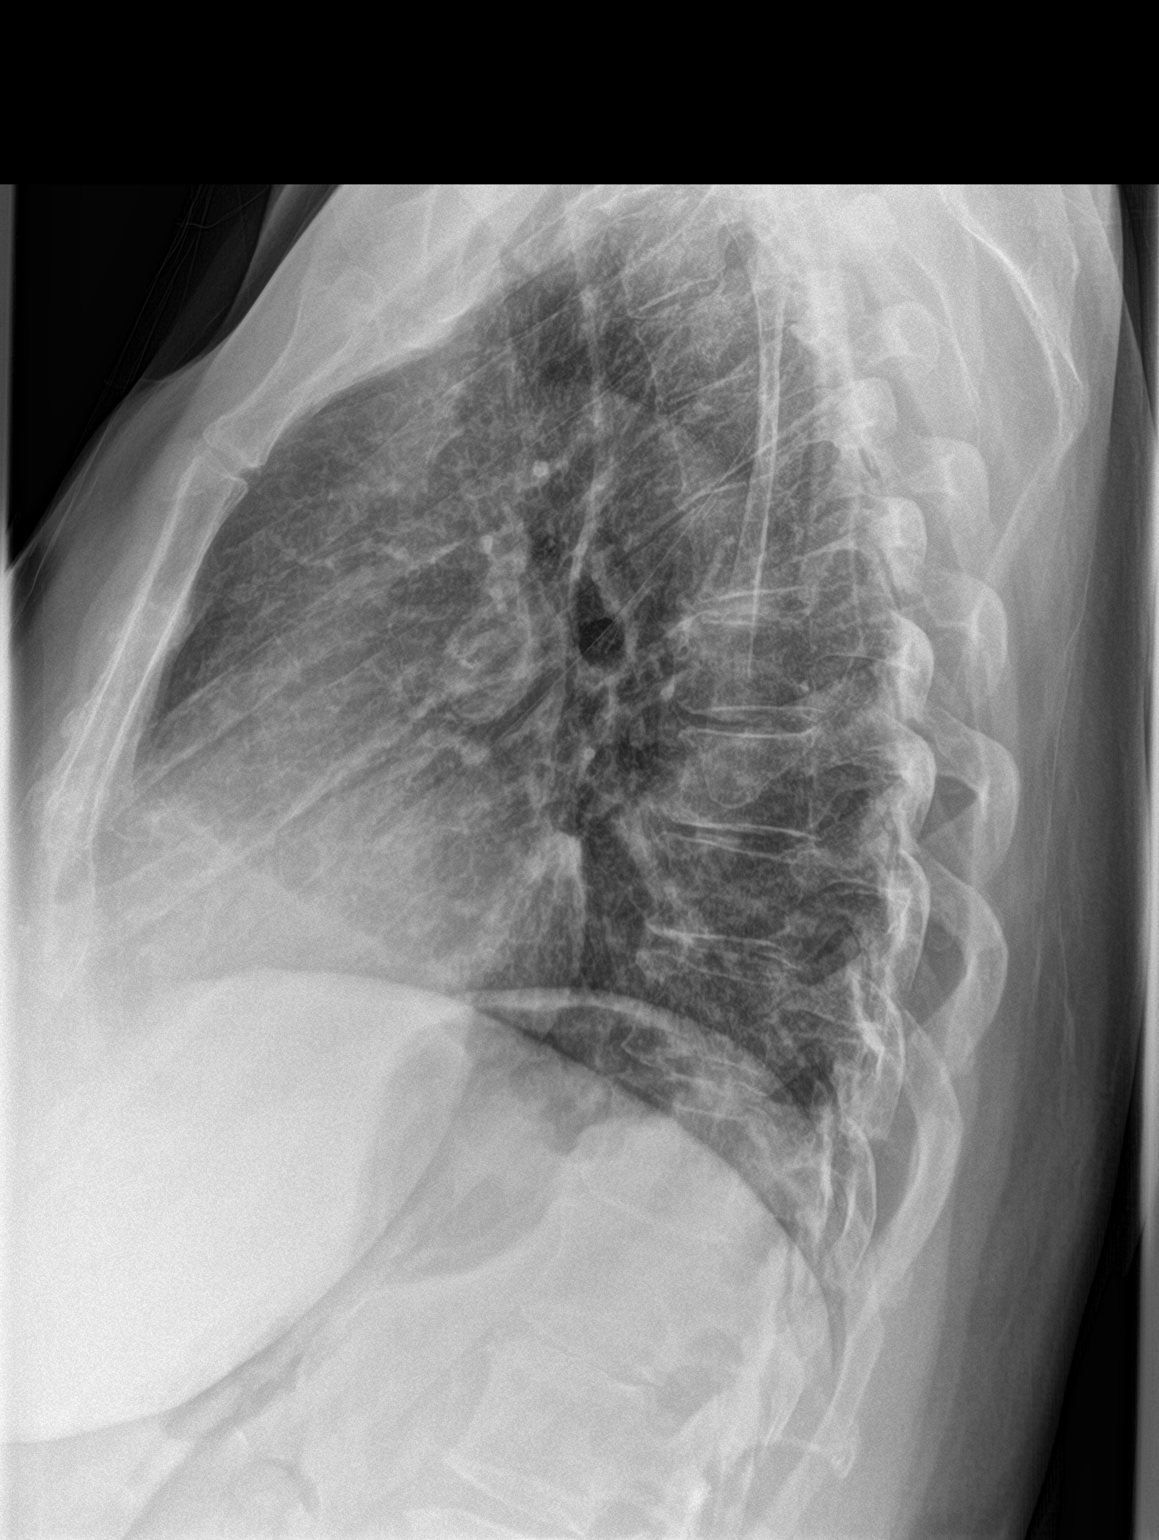

[swimmer]
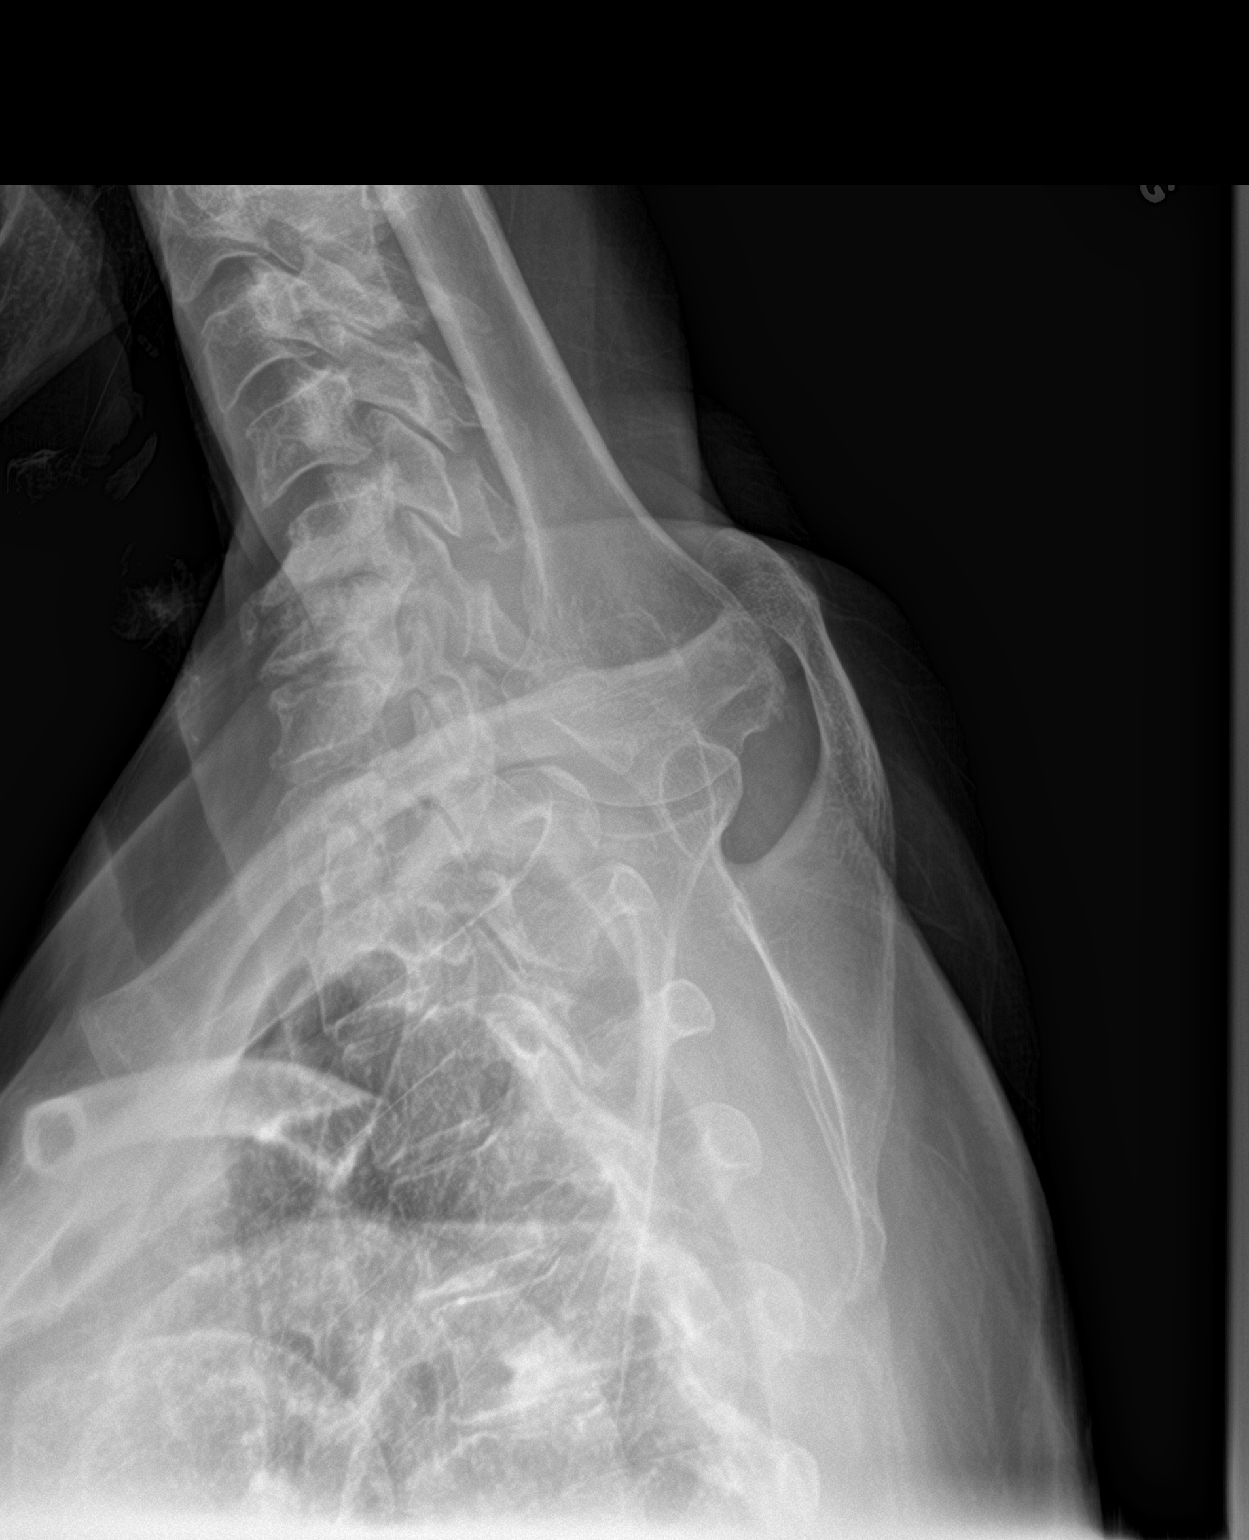

[3 of 3 positions shown; findings below may reference images not displayed]

FINDINGS: Biapical pleuroparenchymal scarring. 11 degrees of dextroconvex
thoracic scoliosis as measured between T6 and T10. Mild thoracic
spondylosis and mild mid thoracic degenerative disc disease with
loss of disc height. No fracture or subluxation identified.

Accentuated lung markings.

The swimmer's projection demonstrates cervical spondylosis and
degenerative disc disease particularly at C5-6 and C6-7, which could
be better studied with dedicated cervical spine radiography.
IMPRESSION: 1. Mild thoracic spondylosis and degenerative disc disease with 11
degrees of dextroconvex thoracic scoliosis. No acute findings.
2. There is evidence of considerable spondylosis and degenerative
disc disease at C5-6 and C6-7.

## 2018-09-01 IMAGING — DX DG LUMBAR SPINE COMPLETE 4+V
5 series · 5 of 5 positions shown · non-contrast
Comparison: 07/05/2012

CLINICAL DATA: Fall 3 days ago with mid and lower back pain since
that time.

EXAM:
LUMBAR SPINE - COMPLETE 4+ VIEW

[l-spine ap]
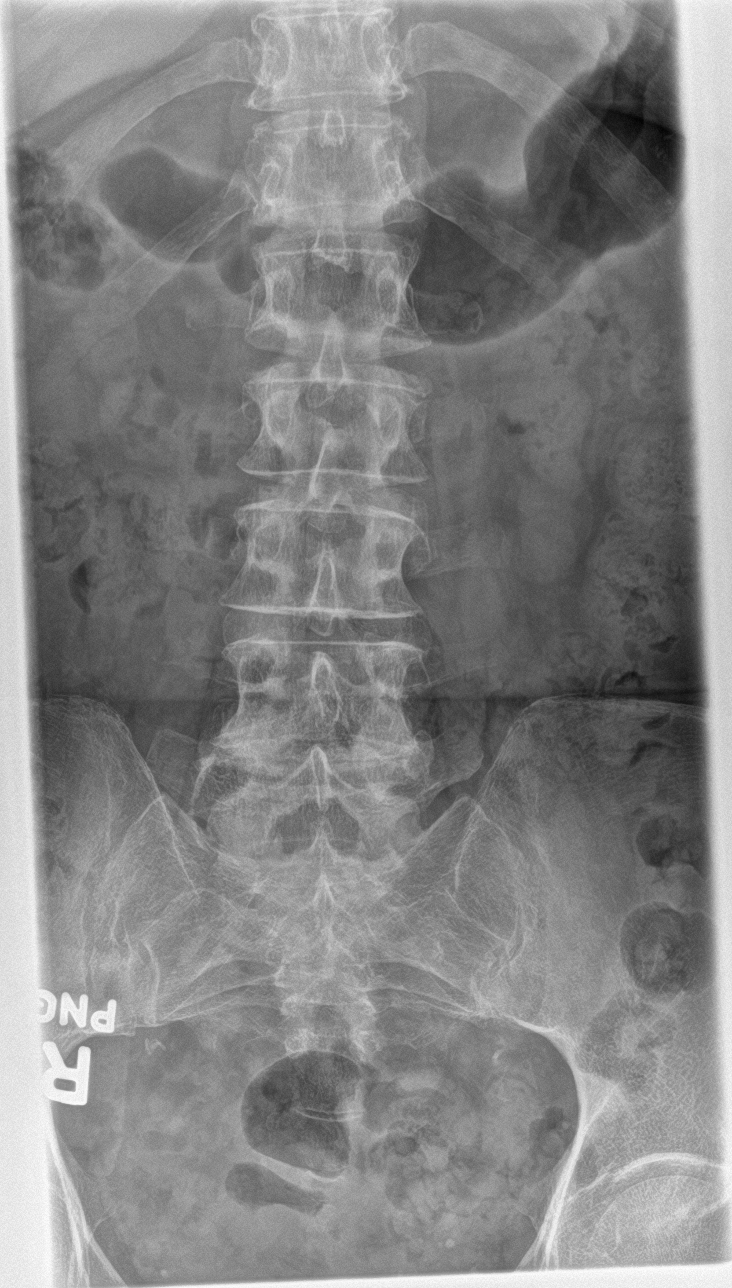

[l-spine obl (1 of 2)]
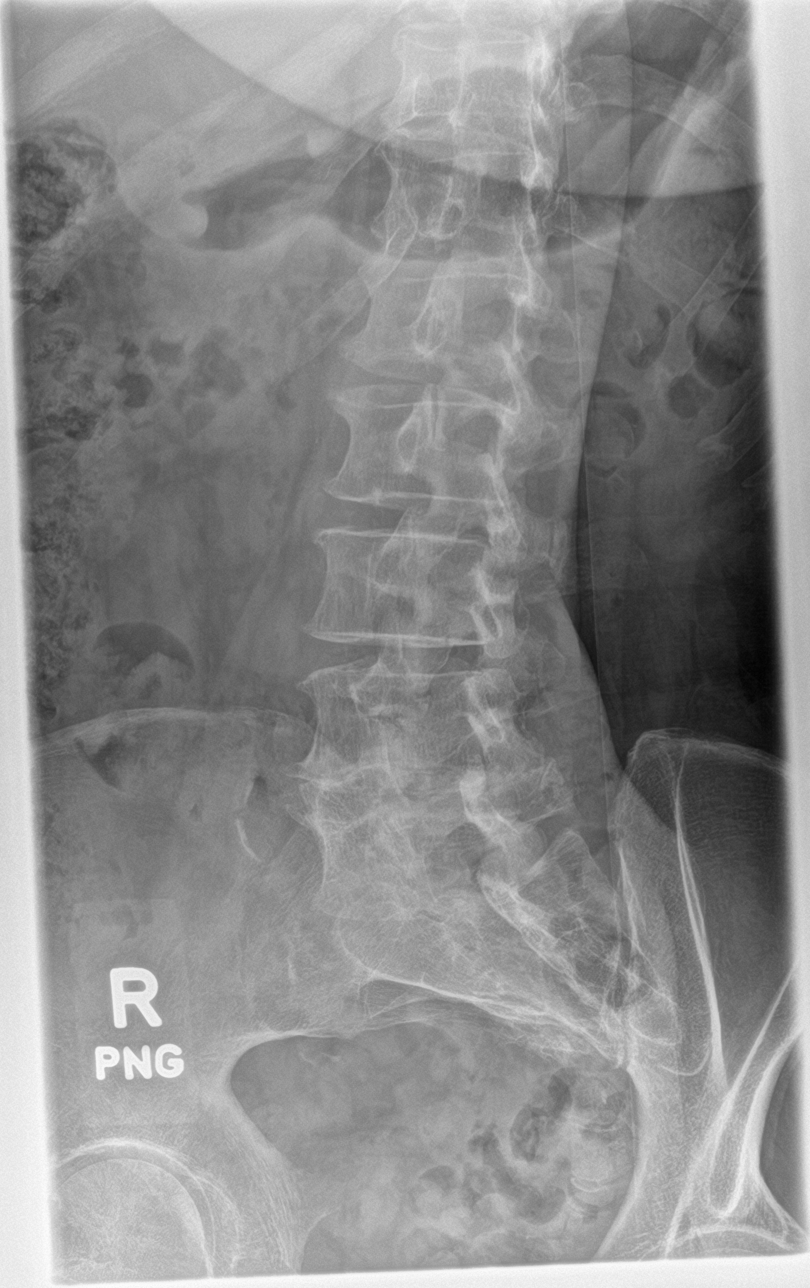

[l-spine obl (2 of 2)]
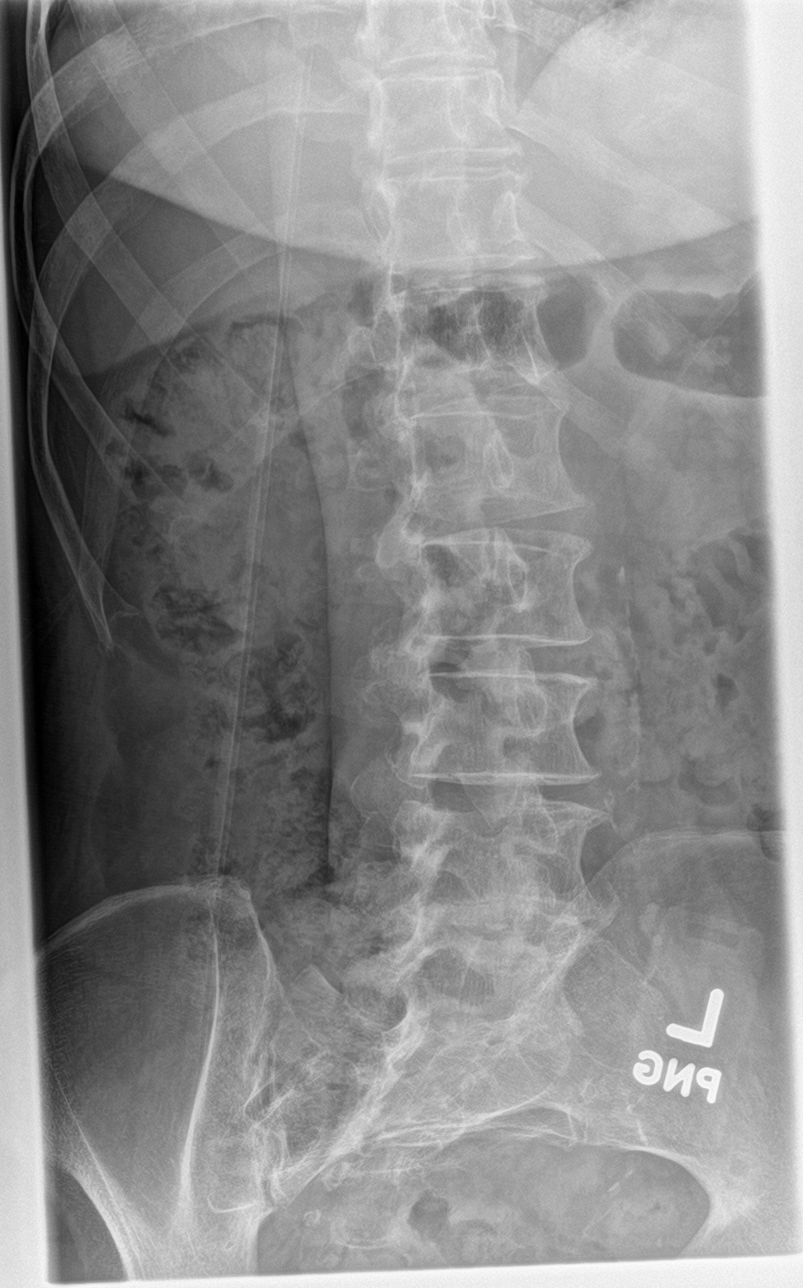

[l-spine lat]
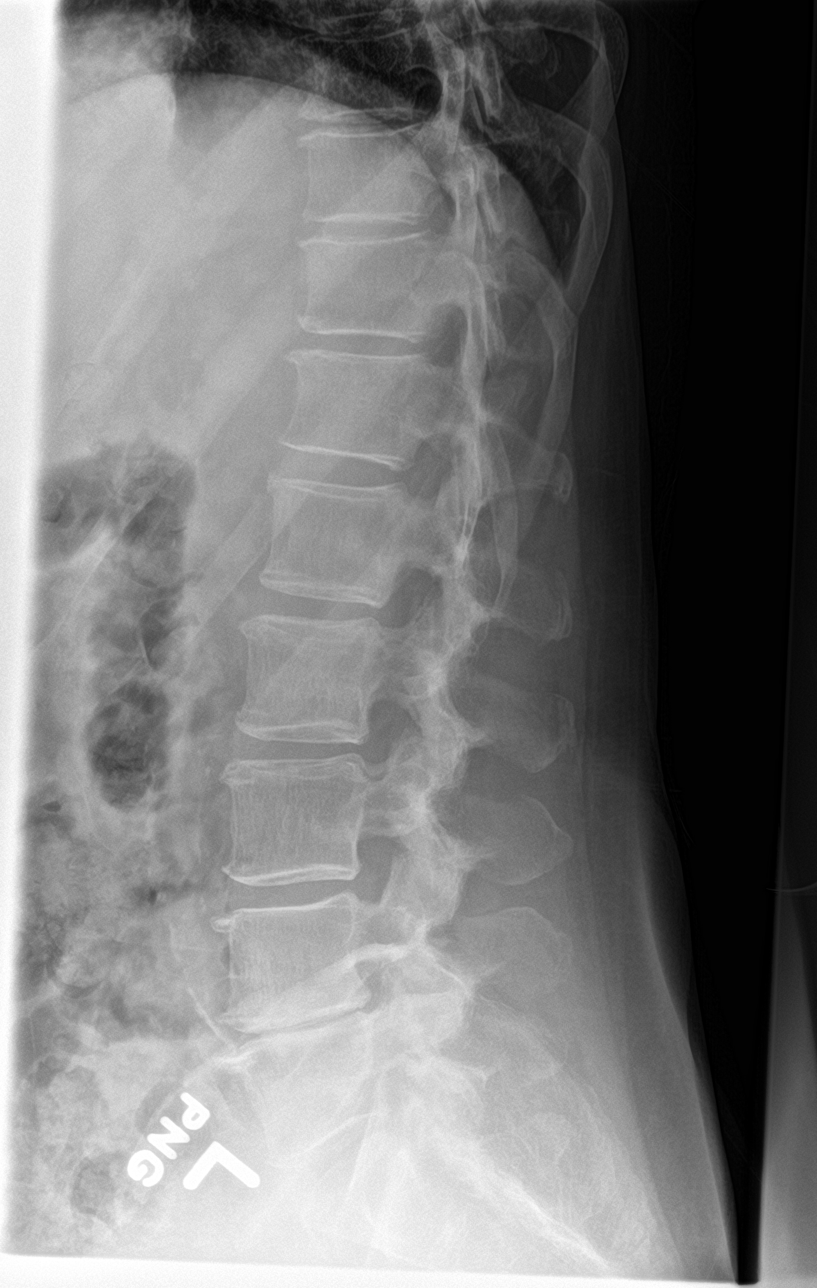

[l-spine spot]
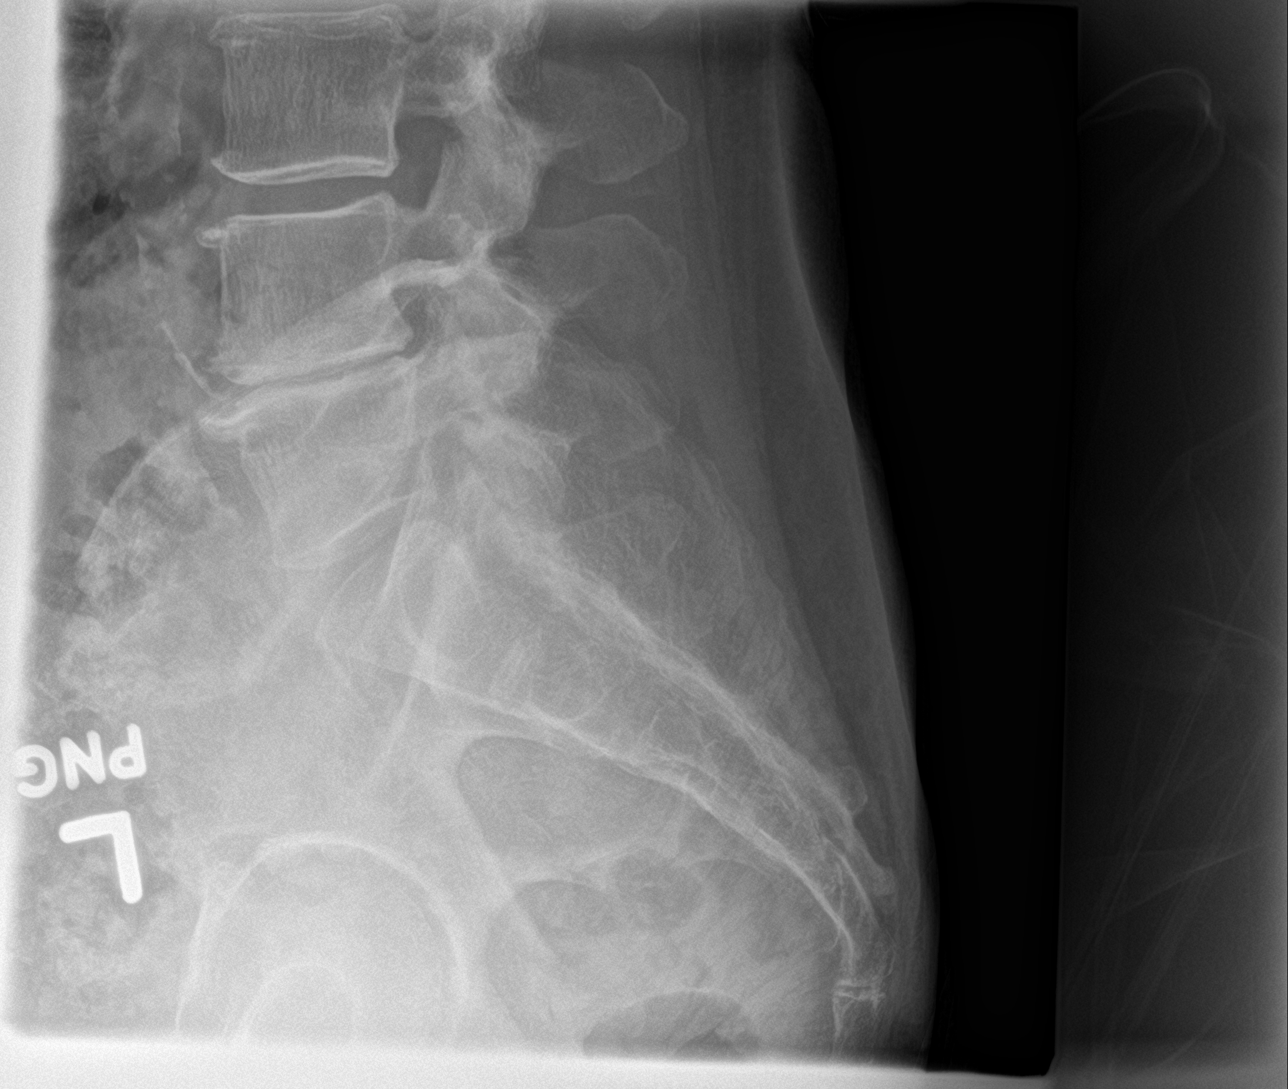

[5 of 5 positions shown; findings below may reference images not displayed]

FINDINGS: The facet arthropathy and intervertebral spurring most notable at
L4-5 with lesser spondylosis elsewhere in the lumbar spine. Loss of
disc height especially at L4-5. No fracture or malalignment in the
lumbar spine is identified.

Aortoiliac atherosclerotic vascular disease.
IMPRESSION: 1. Lumbar spondylosis and degenerative disc disease, severe at L4-5.
No acute findings.
2.  Aortoiliac atherosclerotic vascular disease.

## 2018-09-04 DIAGNOSIS — R5383 Other fatigue: Secondary | ICD-10-CM | POA: Diagnosis not present

## 2018-09-04 DIAGNOSIS — E7800 Pure hypercholesterolemia, unspecified: Secondary | ICD-10-CM | POA: Diagnosis not present

## 2018-09-04 DIAGNOSIS — Z87891 Personal history of nicotine dependence: Secondary | ICD-10-CM | POA: Diagnosis not present

## 2018-09-04 DIAGNOSIS — M129 Arthropathy, unspecified: Secondary | ICD-10-CM | POA: Diagnosis not present

## 2018-09-04 DIAGNOSIS — M1712 Unilateral primary osteoarthritis, left knee: Secondary | ICD-10-CM | POA: Diagnosis not present

## 2018-09-04 DIAGNOSIS — F17200 Nicotine dependence, unspecified, uncomplicated: Secondary | ICD-10-CM | POA: Diagnosis not present

## 2018-09-04 DIAGNOSIS — E559 Vitamin D deficiency, unspecified: Secondary | ICD-10-CM | POA: Diagnosis not present

## 2018-09-04 DIAGNOSIS — M5136 Other intervertebral disc degeneration, lumbar region: Secondary | ICD-10-CM | POA: Diagnosis not present

## 2018-09-04 DIAGNOSIS — M25552 Pain in left hip: Secondary | ICD-10-CM | POA: Diagnosis not present

## 2018-09-04 DIAGNOSIS — E1165 Type 2 diabetes mellitus with hyperglycemia: Secondary | ICD-10-CM | POA: Diagnosis not present

## 2018-09-04 DIAGNOSIS — Z79899 Other long term (current) drug therapy: Secondary | ICD-10-CM | POA: Diagnosis not present

## 2018-09-18 DIAGNOSIS — R0602 Shortness of breath: Secondary | ICD-10-CM | POA: Diagnosis not present

## 2018-09-18 DIAGNOSIS — N390 Urinary tract infection, site not specified: Secondary | ICD-10-CM | POA: Diagnosis not present

## 2018-09-18 DIAGNOSIS — R42 Dizziness and giddiness: Secondary | ICD-10-CM | POA: Diagnosis not present

## 2018-09-18 DIAGNOSIS — R9431 Abnormal electrocardiogram [ECG] [EKG]: Secondary | ICD-10-CM | POA: Diagnosis not present

## 2018-09-18 DIAGNOSIS — I10 Essential (primary) hypertension: Secondary | ICD-10-CM | POA: Diagnosis not present

## 2018-09-18 DIAGNOSIS — R0689 Other abnormalities of breathing: Secondary | ICD-10-CM | POA: Diagnosis not present

## 2018-09-18 DIAGNOSIS — E1165 Type 2 diabetes mellitus with hyperglycemia: Secondary | ICD-10-CM | POA: Diagnosis not present

## 2018-09-21 ENCOUNTER — Emergency Department (HOSPITAL_BASED_OUTPATIENT_CLINIC_OR_DEPARTMENT_OTHER): Payer: Medicare Other

## 2018-09-21 ENCOUNTER — Emergency Department (HOSPITAL_BASED_OUTPATIENT_CLINIC_OR_DEPARTMENT_OTHER)
Admission: EM | Admit: 2018-09-21 | Discharge: 2018-09-21 | Disposition: A | Discharge status: Home / Self Care | Payer: Medicare Other | Attending: Emergency Medicine | Admitting: Emergency Medicine

## 2018-09-21 ENCOUNTER — Other Ambulatory Visit: Payer: Self-pay

## 2018-09-21 ENCOUNTER — Encounter (HOSPITAL_BASED_OUTPATIENT_CLINIC_OR_DEPARTMENT_OTHER): Payer: Self-pay | Admitting: *Deleted

## 2018-09-21 DIAGNOSIS — E119 Type 2 diabetes mellitus without complications: Secondary | ICD-10-CM | POA: Diagnosis not present

## 2018-09-21 DIAGNOSIS — E1165 Type 2 diabetes mellitus with hyperglycemia: Secondary | ICD-10-CM | POA: Diagnosis not present

## 2018-09-21 DIAGNOSIS — R7989 Other specified abnormal findings of blood chemistry: Secondary | ICD-10-CM

## 2018-09-21 DIAGNOSIS — R911 Solitary pulmonary nodule: Secondary | ICD-10-CM | POA: Diagnosis not present

## 2018-09-21 DIAGNOSIS — R0689 Other abnormalities of breathing: Secondary | ICD-10-CM | POA: Diagnosis not present

## 2018-09-21 DIAGNOSIS — Z7982 Long term (current) use of aspirin: Secondary | ICD-10-CM | POA: Diagnosis not present

## 2018-09-21 DIAGNOSIS — R791 Abnormal coagulation profile: Secondary | ICD-10-CM | POA: Diagnosis not present

## 2018-09-21 DIAGNOSIS — E041 Nontoxic single thyroid nodule: Secondary | ICD-10-CM | POA: Insufficient documentation

## 2018-09-21 DIAGNOSIS — Z7984 Long term (current) use of oral hypoglycemic drugs: Secondary | ICD-10-CM | POA: Diagnosis not present

## 2018-09-21 DIAGNOSIS — F17200 Nicotine dependence, unspecified, uncomplicated: Secondary | ICD-10-CM | POA: Insufficient documentation

## 2018-09-21 DIAGNOSIS — Z79899 Other long term (current) drug therapy: Secondary | ICD-10-CM | POA: Diagnosis not present

## 2018-09-21 DIAGNOSIS — I10 Essential (primary) hypertension: Secondary | ICD-10-CM | POA: Diagnosis not present

## 2018-09-21 DIAGNOSIS — R9431 Abnormal electrocardiogram [ECG] [EKG]: Secondary | ICD-10-CM | POA: Diagnosis not present

## 2018-09-21 DIAGNOSIS — N390 Urinary tract infection, site not specified: Secondary | ICD-10-CM | POA: Diagnosis not present

## 2018-09-21 DIAGNOSIS — R748 Abnormal levels of other serum enzymes: Secondary | ICD-10-CM | POA: Diagnosis not present

## 2018-09-21 DIAGNOSIS — R42 Dizziness and giddiness: Secondary | ICD-10-CM | POA: Diagnosis not present

## 2018-09-21 LAB — CBC
HCT: 40.3 % (ref 36.0–46.0)
Hemoglobin: 13.2 g/dL (ref 12.0–15.0)
MCH: 30.3 pg (ref 26.0–34.0)
MCHC: 32.8 g/dL (ref 30.0–36.0)
MCV: 92.6 fL (ref 80.0–100.0)
Platelets: 377 10*3/uL (ref 150–400)
RBC: 4.35 MIL/uL (ref 3.87–5.11)
RDW: 12.2 % (ref 11.5–15.5)
WBC: 10.8 10*3/uL — ABNORMAL HIGH (ref 4.0–10.5)
nRBC: 0 % (ref 0.0–0.2)

## 2018-09-21 LAB — BASIC METABOLIC PANEL
Anion gap: 10 (ref 5–15)
BUN: 23 mg/dL (ref 8–23)
CO2: 22 mmol/L (ref 22–32)
Calcium: 9.6 mg/dL (ref 8.9–10.3)
Chloride: 102 mmol/L (ref 98–111)
Creatinine, Ser: 0.99 mg/dL (ref 0.44–1.00)
GFR calc Af Amer: >60 mL/min (ref >60)
GFR calc non Af Amer: 58 mL/min — ABNORMAL LOW (ref >60)
Glucose, Bld: 82 mg/dL (ref 70–99)
Potassium: 3.9 mmol/L (ref 3.5–5.1)
Sodium: 134 mmol/L — ABNORMAL LOW (ref 135–145)

## 2018-09-21 LAB — CBG MONITORING, ED: Glucose-Capillary: 74 mg/dL (ref 70–99)

## 2018-09-21 MED ORDER — SODIUM CHLORIDE 0.9 % IV SOLN
INTRAVENOUS | Status: DC
  Administered 2018-09-21: 19:00:00 via INTRAVENOUS

## 2018-09-21 MED ORDER — IOHEXOL 350 MG/ML SOLN
100.0000 mL | Freq: Once | INTRAVENOUS | Status: AC | PRN
  Administered 2018-09-21: 64 mL via INTRAVENOUS

## 2018-09-21 NOTE — ED Triage Notes (Signed)
Pt sent here from PMD office for + dimer . C/o Dizziness

## 2018-09-21 NOTE — ED Notes (Signed)
Pt reports she was seen by PCP for dizziness x 2 wks; describes as room spinning; D dimer was elevated, so they referred her here

## 2018-09-21 NOTE — Discharge Instructions (Addendum)
Work-up for the elevated d-dimer without any acute findings.  Labs are normal.  CT Angie of chest showed no evidence of any blood clots.  Bilateral ultrasounds of both lower extremities showed no evidence of deep vein thrombosis.  CT did show some evidence of pulmonary nodules which will need routine follow-up.  Also showed evidence of a thyroid nodule which will also need routine follow-up.  Call your primary care doctor for follow-up.  Return for any new or worse symptoms.

## 2018-09-21 NOTE — ED Provider Notes (Signed)
[facility] EMERGENCY DEPARTMENT Provider Note   CSN: [id number] Arrival date & time: 09/21/18  1831    History   Chief Complaint Chief Complaint  Patient presents with   + dimer    HPI Valerie Wade is a 73 y.o. female.     Patient's primary care provider was Dr. Cathlean Cower.  But now she is followed at [facility] in the clinic.  She has had a complaint of dizziness that started 2 weeks ago.  Her primary care doctor has been working this up.  In the past have been related to abnormal thyroid testing.  But this time it was okay.  She was started on an antibiotic and the dizziness has improved significantly.  She was sent in because part of her testing showed an elevated d-dimer.  Not able to confirm that.  Patient denies any chest pain shortness of breath or any leg swelling.     Past Medical History:  Diagnosis Date   Arthritis    blat thumb cmc   Depression    Diabetes mellitus without complication (HCC)    High cholesterol    Hypertension    Hyperthyroidism    Left thyroid nodule 10/01/2015   Tinnitus of left ear     Patient Active Problem List   Diagnosis Date Noted   Skin lesion 02/28/2018   Left otitis media 11/15/2017   Allergic rhinitis 11/15/2017   Epigastric pain 09/21/2017   Acute left-sided low back pain without sciatica 02/14/2017   Adjustment disorder with anxiety 01/25/2017   Insomnia 01/25/2017   Skin nodule 12/14/2016   Atherosclerosis of aorta ([place]) 06/28/2016   Tooth pain 06/02/2016   Pain of right thumb 06/02/2016   Left thyroid nodule 10/01/2015   Chronic low back pain 03/19/2015   Hypertension    Diabetes mellitus without complication (HCC)    High cholesterol    Anxiety and depression    Hyperthyroidism    Arthritis     Past Surgical History:  Procedure Laterality Date   APPENDECTOMY     KNEE SURGERY Left    meniscal tear     OB History    Gravida  2   Para      Term      Preterm     AB      Living  2     SAB      TAB      Ectopic      Multiple      Live Births           Obstetric Comments  Declined to answer if she had abortions         Home Medications    Prior to Admission medications   Medication Sig Start Date End Date Taking? Authorizing Provider  ACCU-CHEK GUIDE test strip USE TO TEST ONCE DAILY E11.9 02/12/18   Biagio Borg, MD  ALPRAZolam Duanne Moron) 0.5 MG tablet Take 1 tablet (0.5 mg total) by mouth 3 (three) times daily as needed for anxiety. To fill Apr 05, 2018 04/09/18   Biagio Borg, MD  aspirin EC 81 MG tablet Take 81 mg by mouth daily.    [provider]  atorvastatin (LIPITOR) 40 MG tablet Take 1 tablet (40 mg total) by mouth daily. 11/15/17   Biagio Borg, MD  blood glucose meter kit and supplies KIT Check blood sugar once a day. Accu Check Guide Meter, Lancets and test strips.  E11.9 11/15/17   Jenny Reichmann,  Hunt Oris, MD  brimonidine (ALPHAGAN) 0.2 % ophthalmic solution INSTILL 1 DROP INTO BOTH EYES TWICE A DAY 02/28/18   [provider]  cetirizine (ZYRTEC) 10 MG tablet Take 1 tablet (10 mg total) by mouth daily. 02/28/18 02/28/19  Biagio Borg, MD  clonazePAM (KLONOPIN) 0.5 MG tablet TAKE 1 TABLET (0.5 MG TOTAL) BY MOUTH 2 (TWO) TIMES DAILY AS NEEDED AS NEEDED FOR ANXIETY 03/06/18   [provider]  glipiZIDE (GLUCOTROL XL) 5 MG 24 hr tablet Take 1 tablet (5 mg total) by mouth daily with breakfast. 02/06/18   Biagio Borg, MD  HYDROcodone-acetaminophen (NORCO) 10-325 MG tablet Take 1 tablet by mouth every 8 (eight) hours as needed for severe pain. T 02/28/18   Biagio Borg, MD  Lancets MISC Use as directed once daily 06/28/16   Biagio Borg, MD  lisinopril-hydrochlorothiazide (PRINZIDE,ZESTORETIC) 20-25 MG tablet Take 1 tablet by mouth daily. 02/28/18   Biagio Borg, MD  meclizine (ANTIVERT) 12.5 MG tablet Take 1 tablet (12.5 mg total) by mouth 3 (three) times daily as needed for dizziness. 11/15/17 11/15/18  Biagio Borg, MD  metFORMIN (GLUCOPHAGE-XR) 500 MG 24 hr tablet TAKE 4 TABLETS BY MOUTH IN THE MORNING 02/28/18   Biagio Borg, MD  Oxycodone HCl 10 MG TABS  03/27/18   [provider]  propranolol (INDERAL) 10 MG tablet Take 1 tablet (10 mg total) by mouth 2 (two) times daily. 11/15/17   Biagio Borg, MD  triamcinolone (NASACORT) 55 MCG/ACT AERO nasal inhaler Place 2 sprays into the nose daily. 02/28/18   Biagio Borg, MD  triamcinolone cream (KENALOG) 0.1 % APPLY TO AFFECTED AREA TWICE DAILY X14 DAYS, THEN AS NEEDED DURING FLARES. 03/14/18   [provider]  zolpidem (AMBIEN) 5 MG tablet TAKE 1 TABLET BY MOUTH ONCE DAILY AT BEDTIME AS NEEDED FOR SLEEP 01/17/18   Biagio Borg, MD    Family History Family History  Problem Relation Age of Onset   Diabetes Mother    Diabetes Sister    Breast cancer Sister    Diabetes Brother     Social History Social History   Tobacco Use   Smoking status: Current Every Day Smoker   Smokeless tobacco: Never Used  Substance Use Topics   Alcohol use: No    Alcohol/week: 0.0 standard drinks   Drug use: No     Allergies   Patient has no known allergies.   Review of Systems Review of Systems  Constitutional: Negative for chills and fever.  HENT: Negative for congestion, rhinorrhea and sore throat.   Eyes: Negative for visual disturbance.  Respiratory: Negative for cough and shortness of breath.   Cardiovascular: Negative for chest pain and leg swelling.  Gastrointestinal: Negative for abdominal pain, diarrhea, nausea and vomiting.  Genitourinary: Negative for dysuria.  Musculoskeletal: Negative for back pain and neck pain.  Skin: Negative for rash.  Neurological: Positive for dizziness. Negative for syncope, facial asymmetry, speech difficulty, weakness, light-headedness, numbness and headaches.  Hematological: Does not bruise/bleed easily.  Psychiatric/Behavioral: Negative for confusion.     Physical Exam Updated Vital  Signs BP 130/72 (BP Location: Right Arm)    Pulse 73    Temp 98.4 F (36.9 C)    Resp 20    Ht 1.702 m ('5\' 7"'$ )    Wt 74.8 kg    SpO2 96%    BMI 25.84 kg/m   Physical Exam Vitals signs and nursing note reviewed.  Constitutional:  General: She is not in acute distress.    Appearance: She is well-developed.  HENT:     Head: Normocephalic and atraumatic.  Eyes:     Extraocular Movements: Extraocular movements intact.     Conjunctiva/sclera: Conjunctivae normal.     Pupils: Pupils are equal, round, and reactive to light.  Neck:     Musculoskeletal: Normal range of motion and neck supple.  Cardiovascular:     Rate and Rhythm: Normal rate and regular rhythm.     Heart sounds: No murmur.  Pulmonary:     Effort: Pulmonary effort is normal. No respiratory distress.     Breath sounds: Normal breath sounds. No wheezing or rales.  Chest:     Chest wall: No tenderness.  Abdominal:     General: Bowel sounds are normal.     Palpations: Abdomen is soft.     Tenderness: There is no abdominal tenderness.  Musculoskeletal: Normal range of motion.        General: No swelling or tenderness.     Right lower leg: No edema.     Left lower leg: No edema.  Skin:    General: Skin is warm and dry.     Capillary Refill: Capillary refill takes less than 2 seconds.  Neurological:     General: No focal deficit present.     Mental Status: She is alert and oriented to person, place, and time.     Cranial Nerves: No cranial nerve deficit.     Sensory: No sensory deficit.     Motor: No weakness.      ED Treatments / Results  Labs (all labs ordered are listed, but only abnormal results are displayed) Labs Reviewed  CBC - Abnormal; Notable for the following components:      Result Value   WBC 10.8 (*)    All other components within normal limits  BASIC METABOLIC PANEL - Abnormal; Notable for the following components:   Sodium 134 (*)    GFR calc non Af Amer 58 (*)    All other components  within normal limits  CBG MONITORING, ED    EKG EKG Interpretation  Date/Time:  Friday September 21 2018 19:21:43 EDT Ventricular Rate:  67 PR Interval:    QRS Duration: 98 QT Interval:  392 QTC Calculation: 414 R Axis:   60 Text Interpretation:  Sinus rhythm Low voltage, precordial leads RSR' in V1 or V2, right VCD or RVH Confirmed by Fredia Sorrow [phone]) on 09/21/2018 7:27:56 PM   Radiology Ct Angio Chest Pe W/cm &/or Wo Cm  Result Date: 09/21/2018 CLINICAL DATA:  Dizziness EXAM: CT ANGIOGRAPHY CHEST WITH CONTRAST TECHNIQUE: Multidetector CT imaging of the chest was performed using the standard protocol during bolus administration of intravenous contrast. Multiplanar CT image reconstructions and MIPs were obtained to evaluate the vascular anatomy. CONTRAST:  33m OMNIPAQUE IOHEXOL 350 MG/ML SOLN COMPARISON:  Chest x-ray 01/15/2015, thyroid scan 02/05/2015 FINDINGS: Cardiovascular: Satisfactory opacification of the pulmonary arteries to the segmental level. No evidence of pulmonary embolism. Nonaneurysmal aorta. Mild aortic atherosclerosis. Normal heart size. No pericardial effusion Mediastinum/Nodes: Midline trachea. 2 cm hypodense nodule inferior pole of left lobe of thyroid. Mildly prominent mediastinal lymph nodes. AP window lymph node measures 8 mm. Right paratracheal lymph node measures 11 mm. Esophagus within normal limits. Lungs/Pleura: Mild emphysematous disease. Scattered pulmonary nodules in the right upper lobe, largest is seen in the right upper lobe anteriorly and measures 4 mm. No acute consolidation or effusion Upper Abdomen: Possible  hypodense nodule left adrenal gland. No acute abnormality Musculoskeletal: No chest wall abnormality. No acute or significant osseous findings. Review of the MIP images confirms the above findings. IMPRESSION: 1. Negative for acute pulmonary embolus. 2. Mild emphysematous disease. 3. Small scattered pulmonary nodules most notable in the right upper  lobe, measuring up to 4 mm in size. No follow-up needed if patient is low-risk (and has no known or suspected primary neoplasm). Non-contrast chest CT can be considered in 12 months if patient is high-risk. This recommendation follows the consensus statement: Guidelines for Management of Incidental Pulmonary Nodules Detected on CT Images: From the Fleischner Society 2017; Radiology 2017; 284:228-243. 4. 2 cm hypodense nodule left lobe of thyroid. This may be correlated with nonemergent thyroid ultrasound as indicated Aortic Atherosclerosis (ICD10-I70.0) and Emphysema (ICD10-J43.9). Electronically Signed   By: Donavan Foil M.D.   On: 09/21/2018 21:24   US Venous Img Lower Bilateral  Result Date: 09/21/2018 CLINICAL DATA:  Positive D-dimer. EXAM: BILATERAL LOWER EXTREMITY VENOUS DOPPLER ULTRASOUND TECHNIQUE: Gray-scale sonography with graded compression, as well as color Doppler and duplex ultrasound were performed to evaluate the lower extremity deep venous systems from the level of the common femoral vein and including the common femoral, femoral, profunda femoral, popliteal and calf veins including the posterior tibial, peroneal and gastrocnemius veins when visible. The superficial great saphenous vein was also interrogated. Spectral Doppler was utilized to evaluate flow at rest and with distal augmentation maneuvers in the common femoral, femoral and popliteal veins. COMPARISON:  None. FINDINGS: RIGHT LOWER EXTREMITY Common Femoral Vein: No evidence of thrombus. Normal compressibility, respiratory phasicity and response to augmentation. Saphenofemoral Junction: No evidence of thrombus. Normal compressibility and flow on color Doppler imaging. Profunda Femoral Vein: No evidence of thrombus. Normal compressibility and flow on color Doppler imaging. Femoral Vein: No evidence of thrombus. Normal compressibility, respiratory phasicity and response to augmentation. Popliteal Vein: No evidence of thrombus. Normal  compressibility, respiratory phasicity and response to augmentation. Calf Veins: No evidence of thrombus. Normal compressibility and flow on color Doppler imaging. Superficial Great Saphenous Vein: No evidence of thrombus. Normal compressibility. Venous Reflux:  None. Other Findings:  None. LEFT LOWER EXTREMITY Common Femoral Vein: No evidence of thrombus. Normal compressibility, respiratory phasicity and response to augmentation. Saphenofemoral Junction: No evidence of thrombus. Normal compressibility and flow on color Doppler imaging. Profunda Femoral Vein: No evidence of thrombus. Normal compressibility and flow on color Doppler imaging. Femoral Vein: No evidence of thrombus. Normal compressibility, respiratory phasicity and response to augmentation. Popliteal Vein: No evidence of thrombus. Normal compressibility, respiratory phasicity and response to augmentation. Calf Veins: No evidence of thrombus. Normal compressibility and flow on color Doppler imaging. Superficial Great Saphenous Vein: No evidence of thrombus. Normal compressibility. Venous Reflux:  None. Other Findings:  None. IMPRESSION: No evidence of deep venous thrombosis in either lower extremity. Electronically Signed   By: Rolm Baptise M.D.   On: 09/21/2018 20:56    Procedures Procedures (including critical care time)  Medications Ordered in ED Medications  0.9 %  sodium chloride infusion ( Intravenous New Bag/Given 09/21/18 1929)  iohexol (OMNIPAQUE) 350 MG/ML injection 100 mL (64 mLs Intravenous Contrast Given 09/21/18 2059)     Initial Impression / Assessment and Plan / ED Course  I have reviewed the triage vital signs and the nursing notes.  Pertinent labs & imaging results that were available during my care of the patient were reviewed by me and considered in my medical decision making (see chart for details).  D-dimer test was not repeated.  But work-up for positive d-dimer to include CT angios chest and bilateral  lower extremity Doppler studies showed no evidence of DVT in the legs.  No acute findings in the chest no evidence of any pulmonary embolus.  There were some pulmonary nodules and evidence of a thyroid nodule.  All these can be followed up routinely.  Patient nontoxic no acute distress.  Recommend follow back up with primary care provider.  Final Clinical Impressions(s) / ED Diagnoses   Final diagnoses:  Elevated d-dimer    ED Discharge Orders    None       Fredia Sorrow, MD 09/21/18 2200

## 2018-10-03 DIAGNOSIS — Z79899 Other long term (current) drug therapy: Secondary | ICD-10-CM | POA: Diagnosis not present

## 2018-10-03 DIAGNOSIS — M25552 Pain in left hip: Secondary | ICD-10-CM | POA: Diagnosis not present

## 2018-10-03 DIAGNOSIS — M1712 Unilateral primary osteoarthritis, left knee: Secondary | ICD-10-CM | POA: Diagnosis not present

## 2018-10-03 DIAGNOSIS — G629 Polyneuropathy, unspecified: Secondary | ICD-10-CM | POA: Diagnosis not present

## 2018-10-03 DIAGNOSIS — R8290 Unspecified abnormal findings in urine: Secondary | ICD-10-CM | POA: Diagnosis not present

## 2018-10-03 DIAGNOSIS — M5136 Other intervertebral disc degeneration, lumbar region: Secondary | ICD-10-CM | POA: Diagnosis not present

## 2018-10-03 DIAGNOSIS — Z87891 Personal history of nicotine dependence: Secondary | ICD-10-CM | POA: Diagnosis not present

## 2018-10-03 DIAGNOSIS — E1165 Type 2 diabetes mellitus with hyperglycemia: Secondary | ICD-10-CM | POA: Diagnosis not present

## 2018-10-03 DIAGNOSIS — F17210 Nicotine dependence, cigarettes, uncomplicated: Secondary | ICD-10-CM | POA: Diagnosis not present

## 2018-10-10 DIAGNOSIS — K635 Polyp of colon: Secondary | ICD-10-CM | POA: Diagnosis not present

## 2018-10-10 DIAGNOSIS — K648 Other hemorrhoids: Secondary | ICD-10-CM | POA: Diagnosis not present

## 2018-10-10 DIAGNOSIS — D369 Benign neoplasm, unspecified site: Secondary | ICD-10-CM | POA: Diagnosis not present

## 2018-10-29 DIAGNOSIS — E041 Nontoxic single thyroid nodule: Secondary | ICD-10-CM | POA: Diagnosis not present

## 2018-10-30 ENCOUNTER — Other Ambulatory Visit: Payer: Self-pay | Admitting: Nurse Practitioner

## 2018-10-30 DIAGNOSIS — G629 Polyneuropathy, unspecified: Secondary | ICD-10-CM | POA: Diagnosis not present

## 2018-10-30 DIAGNOSIS — Z87891 Personal history of nicotine dependence: Secondary | ICD-10-CM | POA: Diagnosis not present

## 2018-10-30 DIAGNOSIS — Z1231 Encounter for screening mammogram for malignant neoplasm of breast: Secondary | ICD-10-CM

## 2018-10-30 DIAGNOSIS — Z1159 Encounter for screening for other viral diseases: Secondary | ICD-10-CM | POA: Diagnosis not present

## 2018-10-30 DIAGNOSIS — R8290 Unspecified abnormal findings in urine: Secondary | ICD-10-CM | POA: Diagnosis not present

## 2018-10-30 DIAGNOSIS — M5136 Other intervertebral disc degeneration, lumbar region: Secondary | ICD-10-CM | POA: Diagnosis not present

## 2018-10-30 DIAGNOSIS — Z79899 Other long term (current) drug therapy: Secondary | ICD-10-CM | POA: Diagnosis not present

## 2018-10-30 DIAGNOSIS — M25552 Pain in left hip: Secondary | ICD-10-CM | POA: Diagnosis not present

## 2018-10-30 DIAGNOSIS — R300 Dysuria: Secondary | ICD-10-CM | POA: Diagnosis not present

## 2018-10-30 DIAGNOSIS — M1712 Unilateral primary osteoarthritis, left knee: Secondary | ICD-10-CM | POA: Diagnosis not present

## 2018-10-30 DIAGNOSIS — F17210 Nicotine dependence, cigarettes, uncomplicated: Secondary | ICD-10-CM | POA: Diagnosis not present

## 2018-10-31 DIAGNOSIS — Z01818 Encounter for other preprocedural examination: Secondary | ICD-10-CM | POA: Diagnosis not present

## 2018-10-31 DIAGNOSIS — K625 Hemorrhage of anus and rectum: Secondary | ICD-10-CM | POA: Diagnosis not present

## 2018-10-31 DIAGNOSIS — K648 Other hemorrhoids: Secondary | ICD-10-CM | POA: Diagnosis not present

## 2018-10-31 DIAGNOSIS — E119 Type 2 diabetes mellitus without complications: Secondary | ICD-10-CM | POA: Diagnosis not present

## 2018-11-05 DIAGNOSIS — D225 Melanocytic nevi of trunk: Secondary | ICD-10-CM | POA: Diagnosis not present

## 2018-11-05 DIAGNOSIS — L821 Other seborrheic keratosis: Secondary | ICD-10-CM | POA: Diagnosis not present

## 2018-11-05 DIAGNOSIS — D235 Other benign neoplasm of skin of trunk: Secondary | ICD-10-CM | POA: Diagnosis not present

## 2018-11-05 DIAGNOSIS — Z780 Asymptomatic menopausal state: Secondary | ICD-10-CM | POA: Diagnosis not present

## 2018-11-07 DIAGNOSIS — M659 Synovitis and tenosynovitis, unspecified: Secondary | ICD-10-CM | POA: Diagnosis not present

## 2018-11-07 DIAGNOSIS — G609 Hereditary and idiopathic neuropathy, unspecified: Secondary | ICD-10-CM | POA: Diagnosis not present

## 2018-11-07 DIAGNOSIS — M5136 Other intervertebral disc degeneration, lumbar region: Secondary | ICD-10-CM | POA: Diagnosis not present

## 2018-11-07 DIAGNOSIS — H8111 Benign paroxysmal vertigo, right ear: Secondary | ICD-10-CM | POA: Diagnosis not present

## 2018-11-07 DIAGNOSIS — R9431 Abnormal electrocardiogram [ECG] [EKG]: Secondary | ICD-10-CM | POA: Diagnosis not present

## 2018-11-09 ENCOUNTER — Other Ambulatory Visit: Payer: Self-pay | Admitting: Internal Medicine

## 2018-11-21 DIAGNOSIS — R9431 Abnormal electrocardiogram [ECG] [EKG]: Secondary | ICD-10-CM | POA: Diagnosis not present

## 2018-11-22 DIAGNOSIS — K648 Other hemorrhoids: Secondary | ICD-10-CM | POA: Diagnosis not present

## 2018-11-28 DIAGNOSIS — G629 Polyneuropathy, unspecified: Secondary | ICD-10-CM | POA: Diagnosis not present

## 2018-11-28 DIAGNOSIS — M1712 Unilateral primary osteoarthritis, left knee: Secondary | ICD-10-CM | POA: Diagnosis not present

## 2018-11-28 DIAGNOSIS — F17210 Nicotine dependence, cigarettes, uncomplicated: Secondary | ICD-10-CM | POA: Diagnosis not present

## 2018-11-28 DIAGNOSIS — M5136 Other intervertebral disc degeneration, lumbar region: Secondary | ICD-10-CM | POA: Diagnosis not present

## 2018-11-28 DIAGNOSIS — M25552 Pain in left hip: Secondary | ICD-10-CM | POA: Diagnosis not present

## 2018-11-28 DIAGNOSIS — Z79899 Other long term (current) drug therapy: Secondary | ICD-10-CM | POA: Diagnosis not present

## 2018-12-01 ENCOUNTER — Other Ambulatory Visit: Payer: Self-pay | Admitting: Internal Medicine

## 2018-12-03 DIAGNOSIS — E1165 Type 2 diabetes mellitus with hyperglycemia: Secondary | ICD-10-CM | POA: Diagnosis not present

## 2018-12-03 DIAGNOSIS — F17210 Nicotine dependence, cigarettes, uncomplicated: Secondary | ICD-10-CM | POA: Diagnosis not present

## 2018-12-03 DIAGNOSIS — I10 Essential (primary) hypertension: Secondary | ICD-10-CM | POA: Diagnosis not present

## 2018-12-03 DIAGNOSIS — R911 Solitary pulmonary nodule: Secondary | ICD-10-CM | POA: Diagnosis not present

## 2018-12-03 DIAGNOSIS — F411 Generalized anxiety disorder: Secondary | ICD-10-CM | POA: Diagnosis not present

## 2018-12-06 ENCOUNTER — Other Ambulatory Visit: Payer: Self-pay | Admitting: Internal Medicine

## 2018-12-06 DIAGNOSIS — E042 Nontoxic multinodular goiter: Secondary | ICD-10-CM | POA: Diagnosis not present

## 2018-12-06 DIAGNOSIS — E041 Nontoxic single thyroid nodule: Secondary | ICD-10-CM | POA: Diagnosis not present

## 2018-12-27 DIAGNOSIS — F17210 Nicotine dependence, cigarettes, uncomplicated: Secondary | ICD-10-CM | POA: Diagnosis not present

## 2018-12-27 DIAGNOSIS — M5136 Other intervertebral disc degeneration, lumbar region: Secondary | ICD-10-CM | POA: Diagnosis not present

## 2018-12-27 DIAGNOSIS — Z79899 Other long term (current) drug therapy: Secondary | ICD-10-CM | POA: Diagnosis not present

## 2018-12-27 DIAGNOSIS — M25552 Pain in left hip: Secondary | ICD-10-CM | POA: Diagnosis not present

## 2018-12-27 DIAGNOSIS — G629 Polyneuropathy, unspecified: Secondary | ICD-10-CM | POA: Diagnosis not present

## 2018-12-27 DIAGNOSIS — Z87891 Personal history of nicotine dependence: Secondary | ICD-10-CM | POA: Diagnosis not present

## 2018-12-27 DIAGNOSIS — M1712 Unilateral primary osteoarthritis, left knee: Secondary | ICD-10-CM | POA: Diagnosis not present

## 2018-12-28 ENCOUNTER — Other Ambulatory Visit: Payer: Self-pay

## 2019-01-08 DIAGNOSIS — H903 Sensorineural hearing loss, bilateral: Secondary | ICD-10-CM | POA: Diagnosis not present

## 2019-01-08 DIAGNOSIS — H9313 Tinnitus, bilateral: Secondary | ICD-10-CM | POA: Diagnosis not present

## 2019-01-24 DIAGNOSIS — Z87891 Personal history of nicotine dependence: Secondary | ICD-10-CM | POA: Diagnosis not present

## 2019-01-24 DIAGNOSIS — M5136 Other intervertebral disc degeneration, lumbar region: Secondary | ICD-10-CM | POA: Diagnosis not present

## 2019-01-24 DIAGNOSIS — M25552 Pain in left hip: Secondary | ICD-10-CM | POA: Diagnosis not present

## 2019-01-24 DIAGNOSIS — M1712 Unilateral primary osteoarthritis, left knee: Secondary | ICD-10-CM | POA: Diagnosis not present

## 2019-01-24 DIAGNOSIS — G629 Polyneuropathy, unspecified: Secondary | ICD-10-CM | POA: Diagnosis not present

## 2019-01-24 DIAGNOSIS — F17210 Nicotine dependence, cigarettes, uncomplicated: Secondary | ICD-10-CM | POA: Diagnosis not present

## 2019-01-24 DIAGNOSIS — Z79899 Other long term (current) drug therapy: Secondary | ICD-10-CM | POA: Diagnosis not present

## 2019-01-30 DIAGNOSIS — I10 Essential (primary) hypertension: Secondary | ICD-10-CM | POA: Diagnosis not present

## 2019-01-30 DIAGNOSIS — E7800 Pure hypercholesterolemia, unspecified: Secondary | ICD-10-CM | POA: Diagnosis not present

## 2019-01-30 DIAGNOSIS — F411 Generalized anxiety disorder: Secondary | ICD-10-CM | POA: Diagnosis not present

## 2019-01-30 DIAGNOSIS — E1165 Type 2 diabetes mellitus with hyperglycemia: Secondary | ICD-10-CM | POA: Diagnosis not present

## 2019-01-30 DIAGNOSIS — F17210 Nicotine dependence, cigarettes, uncomplicated: Secondary | ICD-10-CM | POA: Diagnosis not present

## 2019-01-31 DIAGNOSIS — E1165 Type 2 diabetes mellitus with hyperglycemia: Secondary | ICD-10-CM | POA: Diagnosis not present

## 2019-01-31 DIAGNOSIS — R5383 Other fatigue: Secondary | ICD-10-CM | POA: Diagnosis not present

## 2019-01-31 DIAGNOSIS — E7800 Pure hypercholesterolemia, unspecified: Secondary | ICD-10-CM | POA: Diagnosis not present

## 2019-01-31 DIAGNOSIS — Z1159 Encounter for screening for other viral diseases: Secondary | ICD-10-CM | POA: Diagnosis not present

## 2019-01-31 DIAGNOSIS — E559 Vitamin D deficiency, unspecified: Secondary | ICD-10-CM | POA: Diagnosis not present

## 2019-02-05 ENCOUNTER — Other Ambulatory Visit: Payer: Self-pay | Admitting: Internal Medicine

## 2019-02-25 DIAGNOSIS — M25552 Pain in left hip: Secondary | ICD-10-CM | POA: Diagnosis not present

## 2019-02-25 DIAGNOSIS — M1712 Unilateral primary osteoarthritis, left knee: Secondary | ICD-10-CM | POA: Diagnosis not present

## 2019-02-25 DIAGNOSIS — F17210 Nicotine dependence, cigarettes, uncomplicated: Secondary | ICD-10-CM | POA: Diagnosis not present

## 2019-02-25 DIAGNOSIS — Z23 Encounter for immunization: Secondary | ICD-10-CM | POA: Diagnosis not present

## 2019-02-25 DIAGNOSIS — M5136 Other intervertebral disc degeneration, lumbar region: Secondary | ICD-10-CM | POA: Diagnosis not present

## 2019-02-25 DIAGNOSIS — Z79891 Long term (current) use of opiate analgesic: Secondary | ICD-10-CM | POA: Diagnosis not present

## 2019-03-05 ENCOUNTER — Other Ambulatory Visit: Payer: Self-pay | Admitting: Internal Medicine

## 2019-03-27 DIAGNOSIS — M5136 Other intervertebral disc degeneration, lumbar region: Secondary | ICD-10-CM | POA: Diagnosis not present

## 2019-03-27 DIAGNOSIS — M1712 Unilateral primary osteoarthritis, left knee: Secondary | ICD-10-CM | POA: Diagnosis not present

## 2019-03-27 DIAGNOSIS — F17210 Nicotine dependence, cigarettes, uncomplicated: Secondary | ICD-10-CM | POA: Diagnosis not present

## 2019-03-27 DIAGNOSIS — M25552 Pain in left hip: Secondary | ICD-10-CM | POA: Diagnosis not present

## 2019-03-27 DIAGNOSIS — Z79899 Other long term (current) drug therapy: Secondary | ICD-10-CM | POA: Diagnosis not present

## 2019-03-27 DIAGNOSIS — G629 Polyneuropathy, unspecified: Secondary | ICD-10-CM | POA: Diagnosis not present

## 2019-04-24 DIAGNOSIS — M1712 Unilateral primary osteoarthritis, left knee: Secondary | ICD-10-CM | POA: Diagnosis not present

## 2019-04-24 DIAGNOSIS — M5136 Other intervertebral disc degeneration, lumbar region: Secondary | ICD-10-CM | POA: Diagnosis not present

## 2019-04-24 DIAGNOSIS — M25552 Pain in left hip: Secondary | ICD-10-CM | POA: Diagnosis not present

## 2019-04-24 DIAGNOSIS — Z79899 Other long term (current) drug therapy: Secondary | ICD-10-CM | POA: Diagnosis not present

## 2019-04-24 DIAGNOSIS — F17210 Nicotine dependence, cigarettes, uncomplicated: Secondary | ICD-10-CM | POA: Diagnosis not present

## 2019-04-24 DIAGNOSIS — G629 Polyneuropathy, unspecified: Secondary | ICD-10-CM | POA: Diagnosis not present

## 2019-04-24 DIAGNOSIS — Z87891 Personal history of nicotine dependence: Secondary | ICD-10-CM | POA: Diagnosis not present

## 2019-05-01 DIAGNOSIS — Z1331 Encounter for screening for depression: Secondary | ICD-10-CM | POA: Diagnosis not present

## 2019-05-01 DIAGNOSIS — E559 Vitamin D deficiency, unspecified: Secondary | ICD-10-CM | POA: Diagnosis not present

## 2019-05-01 DIAGNOSIS — Z1339 Encounter for screening examination for other mental health and behavioral disorders: Secondary | ICD-10-CM | POA: Diagnosis not present

## 2019-05-01 DIAGNOSIS — R42 Dizziness and giddiness: Secondary | ICD-10-CM | POA: Diagnosis not present

## 2019-05-01 DIAGNOSIS — E1165 Type 2 diabetes mellitus with hyperglycemia: Secondary | ICD-10-CM | POA: Diagnosis not present

## 2019-05-01 DIAGNOSIS — Z79899 Other long term (current) drug therapy: Secondary | ICD-10-CM | POA: Diagnosis not present

## 2019-05-01 DIAGNOSIS — Z0000 Encounter for general adult medical examination without abnormal findings: Secondary | ICD-10-CM | POA: Diagnosis not present

## 2019-05-01 DIAGNOSIS — E7800 Pure hypercholesterolemia, unspecified: Secondary | ICD-10-CM | POA: Diagnosis not present

## 2019-05-01 DIAGNOSIS — Z114 Encounter for screening for human immunodeficiency virus [HIV]: Secondary | ICD-10-CM | POA: Diagnosis not present

## 2019-05-01 DIAGNOSIS — F17210 Nicotine dependence, cigarettes, uncomplicated: Secondary | ICD-10-CM | POA: Diagnosis not present

## 2019-05-01 DIAGNOSIS — M129 Arthropathy, unspecified: Secondary | ICD-10-CM | POA: Diagnosis not present

## 2019-05-01 DIAGNOSIS — R5383 Other fatigue: Secondary | ICD-10-CM | POA: Diagnosis not present

## 2019-05-01 DIAGNOSIS — R0602 Shortness of breath: Secondary | ICD-10-CM | POA: Diagnosis not present

## 2019-11-06 ENCOUNTER — Other Ambulatory Visit: Payer: Self-pay | Admitting: Internal Medicine

## 2019-11-06 NOTE — Telephone Encounter (Signed)
Please refill as per office routine med refill policy (all routine meds refilled for 3 mo or monthly per pt preference up to one year from last visit, then month to month grace period for 3 mo, then further med refills will have to be denied)  

## 2020-07-29 ENCOUNTER — Encounter: Payer: Self-pay | Admitting: Internal Medicine

## 2020-09-09 ENCOUNTER — Ambulatory Visit: Payer: Medicare Other | Admitting: Podiatry

## 2021-02-10 ENCOUNTER — Other Ambulatory Visit: Payer: Self-pay | Admitting: Nurse Practitioner

## 2021-02-10 DIAGNOSIS — Z1231 Encounter for screening mammogram for malignant neoplasm of breast: Secondary | ICD-10-CM

## 2021-02-10 DIAGNOSIS — Z780 Asymptomatic menopausal state: Secondary | ICD-10-CM

## 2021-02-26 ENCOUNTER — Ambulatory Visit (INDEPENDENT_AMBULATORY_CARE_PROVIDER_SITE_OTHER): Payer: Medicare Other | Admitting: Podiatry

## 2021-02-26 ENCOUNTER — Other Ambulatory Visit: Payer: Self-pay

## 2021-02-26 ENCOUNTER — Encounter: Payer: Self-pay | Admitting: Podiatry

## 2021-02-26 DIAGNOSIS — M2010 Hallux valgus (acquired), unspecified foot: Secondary | ICD-10-CM | POA: Diagnosis not present

## 2021-02-26 DIAGNOSIS — E1142 Type 2 diabetes mellitus with diabetic polyneuropathy: Secondary | ICD-10-CM | POA: Diagnosis not present

## 2021-02-26 DIAGNOSIS — E1140 Type 2 diabetes mellitus with diabetic neuropathy, unspecified: Secondary | ICD-10-CM | POA: Insufficient documentation

## 2021-02-26 NOTE — Progress Notes (Signed)
This patient presents to the office with chief complaint diabetic feet.  Patient says she feels her feet are cold but upon touching her feet she states her feet are warm to the touch.  Patient has no history of infection or drainage from both feet.. This patient presents  to the office today for  a foot evaluation due to history of  diabetes. She also is interested in diabetic shoes.  General Appearance  Alert, conversant and in no acute stress.  Vascular  Dorsalis pedis and posterior tibial  pulses are palpable  bilaterally.  Capillary return is within normal limits  bilaterally. Temperature is within normal limits  bilaterally.  Neurologic  Senn-Weinstein monofilament wire test within normal limits  right foot.  LOPS diminished left foot. Muscle power within normal limits bilaterally.  Nails Thick disfigured discolored nails with subungual debris  from hallux to fifth toes bilaterally. No evidence of bacterial infection or drainage bilaterally.  Orthopedic  No limitations of motion of motion feet .  No crepitus or effusions noted.  Mild  HAV  B/L.  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  Hypermobility both feet.   Skin  normotropic skin with no porokeratosis noted bilaterally.  No signs of infections or ulcers noted.      Diabetes with neuropathy  IE   A diabetic foot exam was performed and there is no evidence of any vascular pathology.   Patient has neurologic pathology left foot.  Patient qualifies for diabetic shoes due to DPN and HAV  B/L.   Discussed her neuropathy with this patient and told her to receive a trial course of gabapentin from her medical doctor. Patient to be measured for diabetic shoes.  RTC 1 year.   Gardiner Barefoot DPM

## 2021-03-04 ENCOUNTER — Ambulatory Visit (INDEPENDENT_AMBULATORY_CARE_PROVIDER_SITE_OTHER): Payer: Medicare Other | Admitting: *Deleted

## 2021-03-04 ENCOUNTER — Other Ambulatory Visit: Payer: Self-pay

## 2021-03-04 DIAGNOSIS — E1142 Type 2 diabetes mellitus with diabetic polyneuropathy: Secondary | ICD-10-CM

## 2021-03-04 DIAGNOSIS — M2010 Hallux valgus (acquired), unspecified foot: Secondary | ICD-10-CM

## 2021-03-04 NOTE — Progress Notes (Signed)
Patient presents to the office today for diabetic shoe and insole measuring.  Patient was measured with brannock device to determine size and width for 1 pair of extra depth shoes and foam casted for 3 pair of insoles.   Documentation of medical necessity will be sent to patient's treating diabetic doctor to verify and sign.   Patient's diabetic provider: Simona Huh, FNP (Dr. Kellie Shropshire)   Shoes and insoles will be ordered at that time and patient will be notified for an appointment for fitting when they arrive.   Shoe size (per patient): 8.5   Brannock measurement: RIGHT - 8.5 C, LEFT - 9 B  Patient shoe selection-   1st choice:   Apex A700W  2nd choice:  N/A  Shoe size ordered: Women's 8.5 Wide (at patient's request)

## 2021-03-18 ENCOUNTER — Ambulatory Visit: Payer: Medicare Other

## 2021-04-19 ENCOUNTER — Other Ambulatory Visit: Payer: Self-pay

## 2021-04-19 ENCOUNTER — Ambulatory Visit: Payer: Medicare Other

## 2021-04-19 DIAGNOSIS — E1142 Type 2 diabetes mellitus with diabetic polyneuropathy: Secondary | ICD-10-CM

## 2021-04-19 NOTE — Progress Notes (Signed)
SITUATION Reason for Visit: Fitting of Diabetic Shoes & Insoles Patient / Caregiver Report:  Patient feels the shoes are too narrow.  OBJECTIVE DATA: Patient History / Diagnosis:  Diabetes Mellitus Change in Status:   None  ACTIONS PERFORMED: In-Person Delivery, patient was fit with: - 1x pair [id number] PDAC approved CAM milled custom diabetic insoles fit to patient's existing shoes.  Shoes ordered for patient were too narrow and will be returned to [employer]. Re-measured patient to ensure correct length and width recorded. Patient determined to be 8.5 XW. Patient selected APEX [address] [place]. 2nd and third pairs of custom inserts held in reserve for new shoes.  Insoles were verified for structural integrity and safety. Patient wore insoles in office. Skin was inspected and free of areas of concern after wearing inserts. Shoes and inserts fit properly. Patient / Caregiver provided with ferbal instruction and demonstration regarding donning, doffing, wear, care, proper fit, function, purpose, cleaning, and use of shoes and insoles ' and in all related precautions and risks and benefits regarding shoes and insoles. Patient / Caregiver was instructed to wear properly fitting socks with shoes at all times. Patient was also provided with verbal instruction regarding how to report any failures or malfunctions of inserts, and necessary follow up care. Patient / Caregiver was also instructed to contact physician regarding change in status that may affect function of inserts.   Patient / Caregiver verbalized undersatnding of instruction provided. Patient / Caregiver demonstrated independence with proper donning and doffing of shoes and inserts.  PLAN Patient to be scheduled when shoes arrived. Plan of care was discussed with and agreed upon by patient and/or caregiver. All questions were answered and concerns addressed.

## 2021-05-04 ENCOUNTER — Ambulatory Visit: Payer: Medicare Other

## 2021-05-04 ENCOUNTER — Other Ambulatory Visit: Payer: Self-pay

## 2021-05-04 DIAGNOSIS — M2012 Hallux valgus (acquired), left foot: Secondary | ICD-10-CM

## 2021-05-04 DIAGNOSIS — E1142 Type 2 diabetes mellitus with diabetic polyneuropathy: Secondary | ICD-10-CM

## 2021-05-04 DIAGNOSIS — M2011 Hallux valgus (acquired), right foot: Secondary | ICD-10-CM

## 2021-05-04 NOTE — Progress Notes (Signed)
SITUATION Reason for Visit: Fitting of Diabetic Shoes & Insoles Patient / Caregiver Report:  Patient is satisfied and reports comfort in shoes  OBJECTIVE DATA: Patient History / Diagnosis:  Diabetic polyneuropathy associated with type 2 diabetes mellitus ([place])  Change in Status:   None  ACTIONS PERFORMED: In-Person Delivery, patient was fit with: - 1x pair A5500 PDAC approved prefabricated Diabetic Shoes: Apex X801W - 3x pair [id number] PDAC approved CAM milled custom diabetic insoles  Shoes and insoles were verified for structural integrity and safety. Patient wore shoes and insoles in office. Skin was inspected and free of areas of concern after wearing shoes and inserts. Shoes and inserts fit properly. Patient / Caregiver provided with ferbal instruction and demonstration regarding donning, doffing, wear, care, proper fit, function, purpose, cleaning, and use of shoes and insoles ' and in all related precautions and risks and benefits regarding shoes and insoles. Patient / Caregiver was instructed to wear properly fitting socks with shoes at all times. Patient was also provided with verbal instruction regarding how to report any failures or malfunctions of shoes or inserts, and necessary follow up care. Patient / Caregiver was also instructed to contact physician regarding change in status that may affect function of shoes and inserts.   Patient / Caregiver verbalized undersatnding of instruction provided. Patient / Caregiver demonstrated independence with proper donning and doffing of shoes and inserts.  PLAN Patient to follow up as needed. Plan of care was discussed with and agreed upon by patient and/or caregiver. All questions were answered and concerns addressed.

## 2021-06-04 ENCOUNTER — Other Ambulatory Visit: Payer: Self-pay | Admitting: Orthopedic Surgery

## 2021-06-04 DIAGNOSIS — G8929 Other chronic pain: Secondary | ICD-10-CM

## 2021-06-04 DIAGNOSIS — M19012 Primary osteoarthritis, left shoulder: Secondary | ICD-10-CM

## 2021-06-28 ENCOUNTER — Ambulatory Visit
Admission: RE | Admit: 2021-06-28 | Discharge: 2021-06-28 | Disposition: A | Discharge status: Home / Self Care | Payer: Medicare Other | Source: Ambulatory Visit | Attending: Orthopedic Surgery | Admitting: Orthopedic Surgery

## 2021-06-28 ENCOUNTER — Other Ambulatory Visit: Payer: Self-pay

## 2021-06-28 DIAGNOSIS — G8929 Other chronic pain: Secondary | ICD-10-CM

## 2021-06-28 DIAGNOSIS — M19012 Primary osteoarthritis, left shoulder: Secondary | ICD-10-CM

## 2021-07-28 ENCOUNTER — Other Ambulatory Visit: Payer: Self-pay

## 2021-07-28 ENCOUNTER — Ambulatory Visit
Admission: RE | Admit: 2021-07-28 | Discharge: 2021-07-28 | Disposition: A | Discharge status: Home / Self Care | Payer: Medicare Other | Source: Ambulatory Visit | Attending: Nurse Practitioner | Admitting: Nurse Practitioner

## 2021-07-28 DIAGNOSIS — Z1231 Encounter for screening mammogram for malignant neoplasm of breast: Secondary | ICD-10-CM

## 2021-07-28 DIAGNOSIS — Z780 Asymptomatic menopausal state: Secondary | ICD-10-CM

## 2022-03-18 ENCOUNTER — Ambulatory Visit (INDEPENDENT_AMBULATORY_CARE_PROVIDER_SITE_OTHER): Payer: Medicare Other | Admitting: Podiatry

## 2022-03-18 ENCOUNTER — Encounter: Payer: Self-pay | Admitting: Podiatry

## 2022-03-18 DIAGNOSIS — M2010 Hallux valgus (acquired), unspecified foot: Secondary | ICD-10-CM

## 2022-03-18 DIAGNOSIS — E1142 Type 2 diabetes mellitus with diabetic polyneuropathy: Secondary | ICD-10-CM

## 2022-03-18 NOTE — Progress Notes (Addendum)
Patient presents to the office for casting for her diabetic shoes.  Caryl Pina is casting her.  She has LOPS left absent and HAV  B/L.    Diagnosis  DPN  HAV  B/L.   ROV.  RTC prn   Gardiner Barefoot DPM

## 2022-03-20 NOTE — Progress Notes (Signed)
Office Visit Note  Patient: Valerie Wade             Date of Birth: [birth year]           MRN: [id number]             PCP: Simona Huh, NP Referring: Simona Huh, NP Visit Date: 03/21/2022   Subjective:  New Patient (Initial Visit) (Referred for abnormal labs. Pain in right 5th digit DIP joint. )   History of Present Illness: Valerie Wade is a 76 y.o. female here for evaluation of positive ANA with chronic joint pain.  She originally started having left shoulder pain issues that started after falling on ice in November 2022.  Due to the ongoing symptoms she had MRI for this in January showing mild osteoarthritis, supraspinatus and infraspinatus tendinosis with calcific tendinosis, and possible longitudinal split tear of biceps tendon none of which were acute compared to previous imaging in April.  She had intra-articular steroid injection by Dr. Ronnie Derby that was very beneficial.  She was also following up with [facility] for pain management with chronic back pain that was also in some exacerbation during the preceding months.  Evaluation at that time showing a positive ANA with low positive RNP of 1.8.  Since then her left shoulder symptoms are mostly doing better still has chronic back pain at about her baseline level.  Recently has been experiencing some burning pain on the right fifth finger lasting 2 or 3 days at a time with complete resolution lasting for up to a few weeks between episodes.  She does not see any visible peripheral joint swelling.  He is also noticing rash on the left lateral ankle that she sometimes treats with topical Kenalog it is visible but not particularly painful or itchy when present.  Outside of this she has not seen any new skin rashes or discolorations.  She denies new oral or nasal ulcers, lymphadenopathy, Raynaud's symptoms, abnormal bruising or bleeding.  Labs reviewed ANA pos RNP 1.8 dsDNA, Sm, SSA, SSB neg RF neg CCP neg ESR  13 CRP <1 Uric acid 7.8  Activities of Daily Living:  Patient reports morning stiffness for 1-2 minutes.   Patient Reports nocturnal pain.  Difficulty dressing/grooming: Reports Difficulty climbing stairs: Denies Difficulty getting out of chair: Denies Difficulty using hands for taps, buttons, cutlery, and/or writing: Denies  Review of Systems  Constitutional:  Positive for fatigue.  HENT:  Positive for mouth dryness. Negative for mouth sores.   Eyes:  Positive for dryness.  Respiratory:  Negative for shortness of breath.   Cardiovascular:  Negative for chest pain and palpitations.  Gastrointestinal:  Negative for blood in stool, constipation and diarrhea.  Endocrine: Positive for increased urination.  Genitourinary:  Positive for involuntary urination.  Musculoskeletal:  Positive for joint pain, joint pain, muscle weakness and morning stiffness. Negative for gait problem, joint swelling, myalgias, muscle tenderness and myalgias.  Skin:  Positive for rash. Negative for color change, hair loss and sensitivity to sunlight.  Neurological:  Positive for dizziness. Negative for headaches.  Hematological:  Negative for swollen glands.  Psychiatric/Behavioral:  Positive for sleep disturbance. Negative for depressed mood. The patient is nervous/anxious.     PMFS History:  Patient Active Problem List   Diagnosis Date Noted   Pain in left shoulder 03/21/2022   Positive ANA (antinuclear antibody) 03/21/2022   Hav (hallux abducto valgus), unspecified laterality 02/26/2021   Diabetic neuropathy ([place]) 02/26/2021   Skin lesion 02/28/2018  Left otitis media 11/15/2017   Allergic rhinitis 11/15/2017   Epigastric pain 09/21/2017   Acute left-sided low back pain without sciatica 02/14/2017   Adjustment disorder with anxiety 01/25/2017   Insomnia 01/25/2017   Skin nodule 12/14/2016   Atherosclerosis of aorta ([place]) 06/28/2016   Tooth pain 06/02/2016   Pain of right thumb 06/02/2016   Left  thyroid nodule 10/01/2015   Chronic low back pain 03/19/2015   Hypertension    Diabetes mellitus without complication ([place])    High cholesterol    Anxiety and depression    Hyperthyroidism    Arthritis     Past Medical History:  Diagnosis Date   Arthritis    blat thumb [facility]   Depression    Diabetes mellitus without complication ([facility])    High cholesterol    Hypertension    Hyperthyroidism    Left thyroid nodule 10/01/2015   Tinnitus of left ear     Family History  Problem Relation Age of Onset   Diabetes Mother    Congestive Heart Failure Mother    Arthritis Mother    Alcohol abuse Father    Diabetes Sister    Breast cancer Sister    Breast cancer Sister    Diabetes Brother    Heart disease Brother    Diabetes Brother    Heart disease Brother    Hernia Son    Testicular cancer Son    Past Surgical History:  Procedure Laterality Date   APPENDECTOMY     KNEE SURGERY Left    meniscal tear   Social History   Social History Narrative   Not on file   Immunization History  Administered Date(s) Administered   Influenza,inj,Quad PF,6+ Mos 03/19/2015   Influenza-Unspecified 03/30/2018   Pneumococcal Conjugate-13 04/02/2015   Zoster Recombinat (Shingrix) 12/13/2017     Objective: Vital Signs: BP 124/84 (BP Location: Right Arm, Patient Position: Sitting, Cuff Size: Normal)   Pulse 70   Resp 15   Ht 5' 6.5" (1.689 m)   Wt 160 lb 9.6 oz (72.8 kg)   BMI 25.53 kg/m    Physical Exam Eyes:     Conjunctiva/sclera: Conjunctivae normal.  Cardiovascular:     Rate and Rhythm: Normal rate and regular rhythm.  Pulmonary:     Effort: Pulmonary effort is normal.     Breath sounds: Normal breath sounds.  Musculoskeletal:     Right lower leg: No edema.     Left lower leg: No edema.  Lymphadenopathy:     Cervical: No cervical adenopathy.  Skin:    General: Skin is warm and dry.  Neurological:     Mental Status: She is alert.  Psychiatric:        Mood and Affect: Mood  normal.      Musculoskeletal Exam:  Left shoulder pain with internal rotation while abducted to horizontal, posterior and lateral tenderness to pressure, no pain with overhead abduction, no palpable swelling Elbows full ROM no tenderness or swelling Wrists full ROM no tenderness or swelling Fingers full ROM no tenderness or swelling, mild heberdon's nodes present on bilateral hands Knees full ROM no tenderness or swelling Ankles full ROM no tenderness or swelling   Investigation: No additional findings.  Imaging: No results found.  Recent Labs: Lab Results  Component Value Date   WBC 10.8 (H) 09/21/2018   HGB 13.2 09/21/2018   PLT 377 09/21/2018   NA 134 (L) 09/21/2018   K 3.9 09/21/2018   CL 102 09/21/2018  CO2 22 09/21/2018   GLUCOSE 82 09/21/2018   BUN 23 09/21/2018   CREATININE 0.99 09/21/2018   BILITOT 0.7 02/06/2018   ALKPHOS 69 02/06/2018   AST 11 02/06/2018   ALT 18 02/06/2018   PROT 7.2 02/06/2018   ALBUMIN 4.1 02/06/2018   CALCIUM 9.6 09/21/2018   GFRAA >60 09/21/2018    Speciality Comments: No specialty comments available.  Procedures:  No procedures performed Allergies: Patient has no allergy information on record.   Assessment / Plan:     Visit Diagnoses: Positive ANA (antinuclear antibody)  Detailed physical exam history and review of systems negative for concerning clinical findings of new or developing connective tissue disease.  The isolated finger symptoms may be consistent with a peripheral neuropathy although the distribution is atypical and nothing present on exam today to evaluate.  Does not have findings suggestive for lupus or mixed connective tissue disease so low positive RNP likely incidental finding.  I recommend we can follow-up just as needed if she is experiencing new symptoms such as Raynaud's peripheral joint swelling or more extensive skin changes.  Arthritis  Some generalized osteoarthritis with degenerative joint change in  multiple areas but no findings concerning for inflammatory disease at this time.  She is already established with orthopedics office and pain management for her back pain and osteoarthritis.  Chronic left shoulder pain  Shoulder symptoms appear to be at about baseline exam findings are consistent with the known mild to moderate osteoarthritis and chronic tendinopathy.  No appreciable inflammatory changes or progression of symptoms.  Orders: No orders of the defined types were placed in this encounter.  No orders of the defined types were placed in this encounter.    Follow-Up Instructions: Return if symptoms worsen or fail to improve, for Left shoulder and right finger pain.   Collier Salina, MD  Note - This record has been created using [employer].  Chart creation errors have been sought, but may not always  have been located. Such creation errors do not reflect on  the standard of medical care.

## 2022-03-21 ENCOUNTER — Encounter: Payer: Self-pay | Admitting: Internal Medicine

## 2022-03-21 ENCOUNTER — Ambulatory Visit: Payer: Medicare Other | Attending: Internal Medicine | Admitting: Internal Medicine

## 2022-03-21 VITALS — BP 124/84 | HR 70 | Resp 15 | Ht 66.5 in | Wt 160.6 lb

## 2022-03-21 DIAGNOSIS — R768 Other specified abnormal immunological findings in serum: Secondary | ICD-10-CM | POA: Insufficient documentation

## 2022-03-21 DIAGNOSIS — M1990 Unspecified osteoarthritis, unspecified site: Secondary | ICD-10-CM | POA: Insufficient documentation

## 2022-03-21 DIAGNOSIS — G8929 Other chronic pain: Secondary | ICD-10-CM | POA: Insufficient documentation

## 2022-03-21 DIAGNOSIS — M25512 Pain in left shoulder: Secondary | ICD-10-CM | POA: Diagnosis present

## 2022-06-08 ENCOUNTER — Ambulatory Visit: Payer: Medicare Other

## 2022-06-08 DIAGNOSIS — E1142 Type 2 diabetes mellitus with diabetic polyneuropathy: Secondary | ICD-10-CM

## 2022-06-08 NOTE — Addendum Note (Signed)
Addended by: Gardiner Barefoot on: 06/08/2022 04:09 PM   Modules accepted: Orders

## 2022-06-08 NOTE — Progress Notes (Signed)
Patient presents to the office today for diabetic shoe and insole measuring.  Patient was measured with brannock device to determine size and width for 1 pair of extra depth shoes and foam casted for 3 pair of insoles.   Documentation of medical necessity will be sent to patient's treating diabetic doctor to verify and sign.   Patient's diabetic provider: Simona Huh   Shoes and insoles will be ordered at that time and patient will be notified for an appointment for fitting when they arrive.   Shoe size (per patient): 9   Brannock measurement: 9 wide  Patient shoe selection-   Shoe choice:   [id number] - Apex   Shoe size ordered: 9 wide

## 2022-07-07 ENCOUNTER — Encounter: Payer: Self-pay | Admitting: Podiatry
